# Patient Record
Sex: Male | Born: 1950
Health system: Southern US, Community
[De-identification: ages and names within clinical notes are randomized; demographics above are authoritative.]

## PROBLEM LIST (undated history)

## (undated) DIAGNOSIS — E785 Hyperlipidemia, unspecified: Secondary | ICD-10-CM

## (undated) DIAGNOSIS — I10 Essential (primary) hypertension: Secondary | ICD-10-CM

## (undated) DIAGNOSIS — N319 Neuromuscular dysfunction of bladder, unspecified: Secondary | ICD-10-CM

## (undated) DIAGNOSIS — I6529 Occlusion and stenosis of unspecified carotid artery: Secondary | ICD-10-CM

## (undated) DIAGNOSIS — I639 Cerebral infarction, unspecified: Secondary | ICD-10-CM

## (undated) DIAGNOSIS — M1990 Unspecified osteoarthritis, unspecified site: Secondary | ICD-10-CM

## (undated) DIAGNOSIS — G9581 Conus medullaris syndrome: Secondary | ICD-10-CM

## (undated) DIAGNOSIS — E538 Deficiency of other specified B group vitamins: Secondary | ICD-10-CM

## (undated) HISTORY — DX: Unspecified osteoarthritis, unspecified site: M19.90

## (undated) HISTORY — DX: Conus medullaris syndrome: G95.81

## (undated) HISTORY — DX: Hyperlipidemia, unspecified: E78.5

## (undated) HISTORY — DX: Neuromuscular dysfunction of bladder, unspecified: N31.9

## (undated) HISTORY — DX: Essential (primary) hypertension: I10

## (undated) HISTORY — DX: Deficiency of other specified B group vitamins: E53.8

## (undated) HISTORY — DX: Occlusion and stenosis of unspecified carotid artery: I65.29

## (undated) HISTORY — DX: Cerebral infarction, unspecified: I63.9

---

## 2011-01-16 ENCOUNTER — Encounter (INDEPENDENT_AMBULATORY_CARE_PROVIDER_SITE_OTHER): Payer: PRIVATE HEALTH INSURANCE | Admitting: Surgery

## 2011-01-16 DIAGNOSIS — I6509 Occlusion and stenosis of unspecified vertebral artery: Secondary | ICD-10-CM

## 2011-01-17 NOTE — Assessment & Plan Note (Signed)
OFFICE VISIT  [name] DOB:  [birth year]                                       01/16/2011 CHART#:[id number]  REASON FOR VISIT:  Abnormal MRI.  CHIEF COMPLAINT:  Dizziness.  HISTORY:  This is a very pleasant 60 year old gentleman I am seeing at the request of Dr. Tomasa Blase for abnormal MRI of the head and neck.  The patient has a history of having had a stroke about 10 years ago.  This was a posterior circulation stroke.  He is medically managed for his hypertension and hyperlipidemia.  He has a history of smoking but quit over 10 years ago at the time of his stroke.  He recently within the past month or 2 has been developing some visual disturbances in both eyes that he attributes to fluorescent lights.  These are associated with dizziness and headache.  When he is outside the exposure of fluorescent lights they go away.  They are becoming less frequent.  He denies localizing symptoms such as numbness and weakness in either extremity.  No amaurosis.  No slurred speech.  Because of his symptoms an MRA/MRI was ordered.  This reveals remote infarcts in the cerebellum greater on the left, mid left vertebral artery occlusion, 38% left carotid stenosis.  No significant right carotid stenosis.  REVIEW OF SYSTEMS:  GENERAL:  Negative for fevers, chills, weight gain, weight loss. VASCULAR:  Positive for history of stroke. CARDIAC:  Negative. GI:  Negative. NEURO:  Positive for dizziness and headaches. PULMONARY:  Negative. HEME:  Negative. GU:  Negative. ENT:  Negative. MUSCULOSKELETAL:  Positive for arthritis. PSYCHIATRIC:  Negative. SKIN:  Negative.  PAST MEDICAL HISTORY:  Osteoarthritis, hypertension, history of stroke, hyperlipidemia.  PAST SURGICAL HISTORY:  Cystectomy, pilonidal.  SOCIAL HISTORY:  He is single with 1 child.  Works as a Naval architect. Does not smoke, quit in 2002.  Does not drink.  FAMILY HISTORY:  His father died at age 3  secondary to motor vehicle accident.  Mother had hypertension, coronary artery disease and stroke. She died at age 64.  ALLERGIES:  Penicillin.  MEDICATIONS:  Please see medical record.  PHYSICAL EXAMINATION:  Vital signs:  Heart rate 66, blood pressure 127/77 on the right, 133/85 on the left.  Respiratory rate 12.  General: He is well-appearing, in no distress.  HEENT:  Within normal limits. Lungs:  Clear bilaterally.  No wheezes or rhonchi.  Cardiovascular: Regular rhythm.  No carotid bruits.  Palpable left posterior tibial pulse.  I could not palpate pedal pulses on the right.  Palpable femoral pulses.  Abdomen:  Soft, nontender.  No pulsatile mass. Musculoskeletal:  No major deformities.  Neurological:  No focal deficits.  Skin:  Without rash.  ASSESSMENT:  Extracranial cerebrovascular disease.  PLAN:  The patient I believe is asymptomatic from the specific findings on his MRA.  With regards to the 38% left carotid stenosis I believe this will need to be followed with ultrasound on a yearly basis.  I will have him come back to see me again in 1 year to follow the progression of this stenosis.  The patient's left vertebral artery is occluded.  He has a normal basilar junction so with collateral flow as well as flow from the right side I do not think this needs to be addressed.  This most likely was the source of his  stroke 10 years ago.  I do not believe his extracranial vascular disease is contributing to his symptoms.Marland Kitchen  He will need to be continually managed for his risk factors which include hypertension and hypercholesterolemia.  His most recent cholesterol profile had an LDL in the 70s.  His triglycerides were elevated at 319. I will plan on seeing him back in 1 year.    Jorge Ny, MD Electronically Signed  VWB/MEDQ  D:  01/16/2011  T:  01/17/2011  Job:  [id number]  cc:   Foye Deer, MD

## 2011-02-08 ENCOUNTER — Encounter: Payer: PRIVATE HEALTH INSURANCE | Admitting: Vascular Surgery

## 2011-12-18 ENCOUNTER — Encounter: Payer: Self-pay | Admitting: Surgery

## 2012-01-15 ENCOUNTER — Other Ambulatory Visit: Payer: PRIVATE HEALTH INSURANCE

## 2012-01-15 ENCOUNTER — Ambulatory Visit: Payer: PRIVATE HEALTH INSURANCE | Admitting: Surgery

## 2016-01-26 DIAGNOSIS — R972 Elevated prostate specific antigen [PSA]: Secondary | ICD-10-CM | POA: Insufficient documentation

## 2016-09-25 DIAGNOSIS — R200 Anesthesia of skin: Secondary | ICD-10-CM | POA: Diagnosis not present

## 2016-09-25 DIAGNOSIS — M79604 Pain in right leg: Secondary | ICD-10-CM | POA: Diagnosis not present

## 2016-09-25 DIAGNOSIS — Z6830 Body mass index (BMI) 30.0-30.9, adult: Secondary | ICD-10-CM | POA: Diagnosis not present

## 2016-09-25 DIAGNOSIS — R32 Unspecified urinary incontinence: Secondary | ICD-10-CM | POA: Diagnosis not present

## 2016-09-25 DIAGNOSIS — I10 Essential (primary) hypertension: Secondary | ICD-10-CM | POA: Diagnosis not present

## 2016-09-25 DIAGNOSIS — R202 Paresthesia of skin: Secondary | ICD-10-CM | POA: Diagnosis not present

## 2016-09-25 DIAGNOSIS — R531 Weakness: Secondary | ICD-10-CM | POA: Diagnosis not present

## 2016-09-25 DIAGNOSIS — Z7982 Long term (current) use of aspirin: Secondary | ICD-10-CM | POA: Diagnosis not present

## 2016-09-25 DIAGNOSIS — M5106 Intervertebral disc disorders with myelopathy, lumbar region: Secondary | ICD-10-CM | POA: Diagnosis not present

## 2016-09-25 DIAGNOSIS — Z79899 Other long term (current) drug therapy: Secondary | ICD-10-CM | POA: Diagnosis not present

## 2016-09-25 DIAGNOSIS — M79605 Pain in left leg: Secondary | ICD-10-CM | POA: Diagnosis not present

## 2016-09-25 DIAGNOSIS — R252 Cramp and spasm: Secondary | ICD-10-CM | POA: Diagnosis not present

## 2016-09-25 DIAGNOSIS — R159 Full incontinence of feces: Secondary | ICD-10-CM | POA: Diagnosis not present

## 2016-09-25 DIAGNOSIS — R2689 Other abnormalities of gait and mobility: Secondary | ICD-10-CM | POA: Diagnosis not present

## 2016-09-25 DIAGNOSIS — E785 Hyperlipidemia, unspecified: Secondary | ICD-10-CM | POA: Diagnosis not present

## 2016-09-26 DIAGNOSIS — M47812 Spondylosis without myelopathy or radiculopathy, cervical region: Secondary | ICD-10-CM | POA: Diagnosis not present

## 2016-09-26 DIAGNOSIS — R159 Full incontinence of feces: Secondary | ICD-10-CM | POA: Diagnosis not present

## 2016-09-26 DIAGNOSIS — M898X1 Other specified disorders of bone, shoulder: Secondary | ICD-10-CM | POA: Diagnosis not present

## 2016-09-26 DIAGNOSIS — E538 Deficiency of other specified B group vitamins: Secondary | ICD-10-CM | POA: Diagnosis not present

## 2016-09-26 DIAGNOSIS — Z8673 Personal history of transient ischemic attack (TIA), and cerebral infarction without residual deficits: Secondary | ICD-10-CM | POA: Diagnosis not present

## 2016-09-26 DIAGNOSIS — R531 Weakness: Secondary | ICD-10-CM | POA: Diagnosis not present

## 2016-09-26 DIAGNOSIS — M48061 Spinal stenosis, lumbar region without neurogenic claudication: Secondary | ICD-10-CM | POA: Diagnosis not present

## 2016-09-26 DIAGNOSIS — I10 Essential (primary) hypertension: Secondary | ICD-10-CM | POA: Diagnosis not present

## 2016-09-26 DIAGNOSIS — Z7982 Long term (current) use of aspirin: Secondary | ICD-10-CM | POA: Diagnosis not present

## 2016-09-26 DIAGNOSIS — R29898 Other symptoms and signs involving the musculoskeletal system: Secondary | ICD-10-CM | POA: Diagnosis not present

## 2016-09-26 DIAGNOSIS — M47816 Spondylosis without myelopathy or radiculopathy, lumbar region: Secondary | ICD-10-CM | POA: Diagnosis not present

## 2016-09-26 DIAGNOSIS — R32 Unspecified urinary incontinence: Secondary | ICD-10-CM | POA: Diagnosis not present

## 2016-09-28 DIAGNOSIS — R262 Difficulty in walking, not elsewhere classified: Secondary | ICD-10-CM | POA: Diagnosis not present

## 2016-09-28 DIAGNOSIS — D519 Vitamin B12 deficiency anemia, unspecified: Secondary | ICD-10-CM | POA: Diagnosis not present

## 2016-09-28 DIAGNOSIS — Z6829 Body mass index (BMI) 29.0-29.9, adult: Secondary | ICD-10-CM | POA: Diagnosis not present

## 2016-09-28 DIAGNOSIS — M5126 Other intervertebral disc displacement, lumbar region: Secondary | ICD-10-CM | POA: Diagnosis not present

## 2016-09-28 DIAGNOSIS — R29898 Other symptoms and signs involving the musculoskeletal system: Secondary | ICD-10-CM | POA: Diagnosis not present

## 2016-10-03 DIAGNOSIS — S14129A Central cord syndrome at unspecified level of cervical spinal cord, initial encounter: Secondary | ICD-10-CM | POA: Diagnosis not present

## 2016-10-05 DIAGNOSIS — D510 Vitamin B12 deficiency anemia due to intrinsic factor deficiency: Secondary | ICD-10-CM | POA: Diagnosis not present

## 2016-10-12 DIAGNOSIS — M48061 Spinal stenosis, lumbar region without neurogenic claudication: Secondary | ICD-10-CM | POA: Diagnosis not present

## 2016-10-12 DIAGNOSIS — S14129A Central cord syndrome at unspecified level of cervical spinal cord, initial encounter: Secondary | ICD-10-CM | POA: Diagnosis not present

## 2016-10-12 DIAGNOSIS — X58XXXA Exposure to other specified factors, initial encounter: Secondary | ICD-10-CM | POA: Diagnosis not present

## 2016-10-12 DIAGNOSIS — M5416 Radiculopathy, lumbar region: Secondary | ICD-10-CM | POA: Diagnosis not present

## 2016-10-13 DIAGNOSIS — D510 Vitamin B12 deficiency anemia due to intrinsic factor deficiency: Secondary | ICD-10-CM | POA: Diagnosis not present

## 2016-10-17 DIAGNOSIS — S14129A Central cord syndrome at unspecified level of cervical spinal cord, initial encounter: Secondary | ICD-10-CM | POA: Diagnosis not present

## 2016-10-20 DIAGNOSIS — D510 Vitamin B12 deficiency anemia due to intrinsic factor deficiency: Secondary | ICD-10-CM | POA: Diagnosis not present

## 2016-10-26 DIAGNOSIS — S14129A Central cord syndrome at unspecified level of cervical spinal cord, initial encounter: Secondary | ICD-10-CM | POA: Diagnosis not present

## 2016-10-26 DIAGNOSIS — M47897 Other spondylosis, lumbosacral region: Secondary | ICD-10-CM | POA: Diagnosis not present

## 2016-10-26 DIAGNOSIS — M47817 Spondylosis without myelopathy or radiculopathy, lumbosacral region: Secondary | ICD-10-CM | POA: Diagnosis not present

## 2016-10-27 DIAGNOSIS — D519 Vitamin B12 deficiency anemia, unspecified: Secondary | ICD-10-CM | POA: Diagnosis not present

## 2016-11-06 DIAGNOSIS — R252 Cramp and spasm: Secondary | ICD-10-CM | POA: Diagnosis not present

## 2016-11-06 DIAGNOSIS — S14129A Central cord syndrome at unspecified level of cervical spinal cord, initial encounter: Secondary | ICD-10-CM | POA: Diagnosis not present

## 2016-11-06 DIAGNOSIS — R600 Localized edema: Secondary | ICD-10-CM | POA: Diagnosis not present

## 2016-11-06 DIAGNOSIS — Z6831 Body mass index (BMI) 31.0-31.9, adult: Secondary | ICD-10-CM | POA: Diagnosis not present

## 2016-11-07 ENCOUNTER — Telehealth: Payer: Self-pay | Admitting: Neurology

## 2016-11-07 DIAGNOSIS — M546 Pain in thoracic spine: Secondary | ICD-10-CM | POA: Diagnosis not present

## 2016-11-07 DIAGNOSIS — M5030 Other cervical disc degeneration, unspecified cervical region: Secondary | ICD-10-CM | POA: Diagnosis not present

## 2016-11-07 DIAGNOSIS — M4722 Other spondylosis with radiculopathy, cervical region: Secondary | ICD-10-CM | POA: Diagnosis not present

## 2016-11-07 DIAGNOSIS — M549 Dorsalgia, unspecified: Secondary | ICD-10-CM | POA: Diagnosis not present

## 2016-11-07 DIAGNOSIS — M4712 Other spondylosis with myelopathy, cervical region: Secondary | ICD-10-CM | POA: Diagnosis not present

## 2016-11-07 DIAGNOSIS — M5136 Other intervertebral disc degeneration, lumbar region: Secondary | ICD-10-CM | POA: Diagnosis not present

## 2016-11-07 NOTE — Telephone Encounter (Signed)
I have discussed this case with Dr. Newell CoralNudelman. He will be referred to this office for an evaluation. This is a gentleman who noted onset suddenly with problems with walking in March 2018. [facility], underwent a CT scan of the head that showed an old cerebellar stroke, no acute process noted. [facility], MRI of the cervical, thoracic, and lumbar spine has been done, he has degenerative changes at the C3-4, C4-5, and C5-6 levels without definite cord signal or cord injury. He also has significant degenerative changes in the low back. He has reported some change in bladder function.  He is being sent to this office for further evaluation to determine the etiology of his walking problem, it is not clear that the spine issues are the sole etiology.  The patient may require EMG and nerve conduction study evaluation. He may require further blood work.

## 2016-11-13 DIAGNOSIS — R8290 Unspecified abnormal findings in urine: Secondary | ICD-10-CM | POA: Diagnosis not present

## 2016-11-13 DIAGNOSIS — R319 Hematuria, unspecified: Secondary | ICD-10-CM | POA: Diagnosis not present

## 2016-11-13 DIAGNOSIS — R109 Unspecified abdominal pain: Secondary | ICD-10-CM | POA: Diagnosis not present

## 2016-11-13 DIAGNOSIS — Y846 Urinary catheterization as the cause of abnormal reaction of the patient, or of later complication, without mention of misadventure at the time of the procedure: Secondary | ICD-10-CM | POA: Diagnosis not present

## 2016-11-13 DIAGNOSIS — T839XXA Unspecified complication of genitourinary prosthetic device, implant and graft, initial encounter: Secondary | ICD-10-CM | POA: Diagnosis not present

## 2016-11-13 DIAGNOSIS — Z8673 Personal history of transient ischemic attack (TIA), and cerebral infarction without residual deficits: Secondary | ICD-10-CM | POA: Diagnosis not present

## 2016-11-13 DIAGNOSIS — T83511A Infection and inflammatory reaction due to indwelling urethral catheter, initial encounter: Secondary | ICD-10-CM | POA: Diagnosis not present

## 2016-11-13 DIAGNOSIS — R100 Acute abdomen: Secondary | ICD-10-CM | POA: Diagnosis not present

## 2016-11-13 DIAGNOSIS — R1030 Lower abdominal pain, unspecified: Secondary | ICD-10-CM | POA: Diagnosis not present

## 2016-11-13 DIAGNOSIS — N390 Urinary tract infection, site not specified: Secondary | ICD-10-CM | POA: Diagnosis not present

## 2016-11-13 DIAGNOSIS — Z7982 Long term (current) use of aspirin: Secondary | ICD-10-CM | POA: Diagnosis not present

## 2016-11-13 DIAGNOSIS — R531 Weakness: Secondary | ICD-10-CM | POA: Diagnosis not present

## 2016-11-13 DIAGNOSIS — N39490 Overflow incontinence: Secondary | ICD-10-CM | POA: Diagnosis not present

## 2016-11-13 DIAGNOSIS — R972 Elevated prostate specific antigen [PSA]: Secondary | ICD-10-CM | POA: Diagnosis not present

## 2016-11-13 DIAGNOSIS — R339 Retention of urine, unspecified: Secondary | ICD-10-CM | POA: Diagnosis not present

## 2016-11-15 ENCOUNTER — Encounter (HOSPITAL_COMMUNITY): Payer: Self-pay | Admitting: *Deleted

## 2016-11-15 ENCOUNTER — Emergency Department (HOSPITAL_COMMUNITY): Payer: PPO

## 2016-11-15 ENCOUNTER — Emergency Department (HOSPITAL_COMMUNITY)
Admission: EM | Admit: 2016-11-15 | Discharge: 2016-11-15 | Disposition: A | Discharge status: Home / Self Care | Payer: PPO | Attending: Emergency Medicine | Admitting: Emergency Medicine

## 2016-11-15 DIAGNOSIS — Z79899 Other long term (current) drug therapy: Secondary | ICD-10-CM | POA: Diagnosis not present

## 2016-11-15 DIAGNOSIS — M6281 Muscle weakness (generalized): Secondary | ICD-10-CM | POA: Diagnosis not present

## 2016-11-15 DIAGNOSIS — Z87891 Personal history of nicotine dependence: Secondary | ICD-10-CM | POA: Insufficient documentation

## 2016-11-15 DIAGNOSIS — M48061 Spinal stenosis, lumbar region without neurogenic claudication: Secondary | ICD-10-CM | POA: Diagnosis not present

## 2016-11-15 DIAGNOSIS — Z7982 Long term (current) use of aspirin: Secondary | ICD-10-CM | POA: Diagnosis not present

## 2016-11-15 DIAGNOSIS — Z8673 Personal history of transient ischemic attack (TIA), and cerebral infarction without residual deficits: Secondary | ICD-10-CM | POA: Diagnosis not present

## 2016-11-15 DIAGNOSIS — R200 Anesthesia of skin: Secondary | ICD-10-CM | POA: Insufficient documentation

## 2016-11-15 DIAGNOSIS — I10 Essential (primary) hypertension: Secondary | ICD-10-CM | POA: Diagnosis not present

## 2016-11-15 DIAGNOSIS — R29898 Other symptoms and signs involving the musculoskeletal system: Secondary | ICD-10-CM

## 2016-11-15 LAB — BASIC METABOLIC PANEL
Anion gap: 11 (ref 5–15)
BUN: 40 mg/dL — AB (ref 6–20)
CO2: 20 mmol/L — ABNORMAL LOW (ref 22–32)
CREATININE: 1.53 mg/dL — AB (ref 0.61–1.24)
Calcium: 8.7 mg/dL — ABNORMAL LOW (ref 8.9–10.3)
Chloride: 108 mmol/L (ref 101–111)
GFR calc Af Amer: 53 mL/min — ABNORMAL LOW (ref >60)
GFR, EST NON AFRICAN AMERICAN: 46 mL/min — AB (ref >60)
GLUCOSE: 128 mg/dL — AB (ref 65–99)
POTASSIUM: 4.2 mmol/L (ref 3.5–5.1)
Sodium: 139 mmol/L (ref 135–145)

## 2016-11-15 LAB — URINALYSIS, ROUTINE W REFLEX MICROSCOPIC
BACTERIA UA: NONE SEEN
Bilirubin Urine: NEGATIVE
Glucose, UA: NEGATIVE mg/dL
Ketones, ur: NEGATIVE mg/dL
Nitrite: NEGATIVE
Protein, ur: >=300 mg/dL — AB
SPECIFIC GRAVITY, URINE: 1.022 (ref 1.005–1.030)
SQUAMOUS EPITHELIAL / LPF: NONE SEEN
pH: 5 (ref 5.0–8.0)

## 2016-11-15 LAB — HEPATIC FUNCTION PANEL
ALK PHOS: 70 U/L (ref 38–126)
ALT: 25 U/L (ref 17–63)
AST: 25 U/L (ref 15–41)
Albumin: 3 g/dL — ABNORMAL LOW (ref 3.5–5.0)
Bilirubin, Direct: 0.1 mg/dL (ref 0.1–0.5)
Indirect Bilirubin: 0.3 mg/dL (ref 0.3–0.9)
TOTAL PROTEIN: 6.7 g/dL (ref 6.5–8.1)
Total Bilirubin: 0.4 mg/dL (ref 0.3–1.2)

## 2016-11-15 LAB — CBC
HEMATOCRIT: 35.9 % — AB (ref 39.0–52.0)
Hemoglobin: 11.6 g/dL — ABNORMAL LOW (ref 13.0–17.0)
MCH: 28.9 pg (ref 26.0–34.0)
MCHC: 32.3 g/dL (ref 30.0–36.0)
MCV: 89.3 fL (ref 78.0–100.0)
PLATELETS: 334 10*3/uL (ref 150–400)
RBC: 4.02 MIL/uL — ABNORMAL LOW (ref 4.22–5.81)
RDW: 14.2 % (ref 11.5–15.5)
WBC: 11.8 10*3/uL — AB (ref 4.0–10.5)

## 2016-11-15 LAB — I-STAT CG4 LACTIC ACID, ED: LACTIC ACID, VENOUS: 1.56 mmol/L (ref 0.5–1.9)

## 2016-11-15 MED ORDER — ZOLPIDEM TARTRATE ER 12.5 MG PO TBCR: 12.5000 mg | EXTENDED_RELEASE_TABLET | Freq: Every evening | ORAL | 0 refills | Status: DC | PRN

## 2016-11-15 MED ORDER — PREDNISONE 10 MG PO TABS: 40.0000 mg | ORAL_TABLET | Freq: Every day | ORAL | 0 refills | Status: AC

## 2016-11-15 MED ORDER — SODIUM CHLORIDE 0.9 % IV SOLN
INTRAVENOUS | Status: DC
  Administered 2016-11-15: 16:00:00 via INTRAVENOUS

## 2016-11-15 MED ORDER — ZOLPIDEM TARTRATE ER 6.25 MG PO TBCR: 6.2500 mg | EXTENDED_RELEASE_TABLET | Freq: Every evening | ORAL | 0 refills | Status: DC | PRN

## 2016-11-15 MED ORDER — LORAZEPAM 2 MG/ML IJ SOLN
1.0000 mg | Freq: Once | INTRAMUSCULAR | Status: AC
  Administered 2016-11-15: 1 mg via INTRAVENOUS
  Filled 2016-11-15: qty 1

## 2016-11-15 MED ORDER — ACETAMINOPHEN 500 MG PO TABS
1000.0000 mg | ORAL_TABLET | Freq: Once | ORAL | Status: AC
  Administered 2016-11-15: 1000 mg via ORAL
  Filled 2016-11-15: qty 2

## 2016-11-15 NOTE — ED Notes (Signed)
Patient transported to MRI via stretcher.

## 2016-11-15 NOTE — ED Notes (Signed)
Patient verbalized understanding of discharge instructions and denies any further needs or questions at this time. VS stable. Patient ambulatory with steady gait, RN assisted to ED entrance in wheelchair.   

## 2016-11-15 NOTE — ED Notes (Signed)
Pt reports he had a catheter placed Monday (two days ago) r/t not being able to empty his bladder. Family at bedside reports pt had bladder infection r/t not being able to empty bladder. Pt states when the catheter was inserted it "felt like 1000 hot needles going into the right leg". Pt states he has had weakness in legs since the last week of March. He reports he had 2 epidural steroids in the month of April.

## 2016-11-15 NOTE — ED Triage Notes (Signed)
Pt sent here for eval from PCP, pt c/o bil hip & leg tingling and pain, pt c/o generalized severe body aches, pt reports inability to ambulate since foley cath placement at urology office this past Monday d/t inability to void, pt takes daily ASA, A&O x4, denies slurred speech, no facial droop present, symptom onset x 3 months ago

## 2016-11-15 NOTE — ED Notes (Signed)
Pt given turkey sandwich per RN. 

## 2016-11-15 NOTE — ED Notes (Signed)
Patient returned from MRI and placed back on monitor

## 2016-11-15 NOTE — ED Notes (Signed)
Pt deneis pain stating his legs fell "asleep"

## 2016-11-15 NOTE — ED Provider Notes (Signed)
[facility]EMERGENCY DEPT Provider Note   CSN: [id number] Arrival date & time: 11/15/16  1202     History   Chief Complaint Chief Complaint  Patient presents with  . Back Pain  . Extremity Weakness    HPI [name] is a 66 y.o. male.  Patient since March is been having trouble with numbness and weakness to his lower extremities from the waist down. Including some saddle numbness and difficulty voiding and having bowel movements. Patient initially transferred from [facility] to [facility] where he underwent MRI evaluation by neurosurgery sounds like there was also evaluation by neurology. Patient eventually had steroid injections which the first one helped second one did not help. Patient now followed by neurosurgery locally Dr. Ezzard Standing who reviewed his MRIs and found no structural abnormalities. He referred him to Dr. Anne Hahn of [facility] neurology. Patient has not been seen by them yet. Patient's last MRI was in the end of March. Patient states symptoms are getting worse. In the meantime had seen urology for difficulty emptying his bladder and had done Foley catheter place with leg bag. Supposedly had a urinary tract infection at that time. Patient has stated above states symptoms are getting worse. Basically feels like he is numb from the waist down. Waist up he feels normal. Patient denies fevers headaches and neck problems or any other symptoms.  What is very interesting is that the patient has no pain associated with this. Does get a burning sensation at times tingling sensation but no pain.      Past Medical History:  Diagnosis Date  . Arthritis   . Carotid artery occlusion   . Hyperlipidemia   . Hypertension   . Stroke [facility])    posterior circulation    There are no active problems to display for this patient.   History reviewed. No pertinent surgical history.     Home Medications    Prior to Admission medications   Medication Sig Start Date End Date Taking?  Authorizing Provider  aspirin 81 MG tablet Take 81 mg by mouth daily.    [provider]  cephALEXin (KEFLEX) 500 MG capsule Take 500 mg by mouth 3 (three) times daily.    [provider]  fenofibrate 160 MG tablet Take 160 mg by mouth daily.    [provider]  lisinopril (PRINIVIL,ZESTRIL) 10 MG tablet Take 10 mg by mouth daily.    [provider]  Multiple Vitamins-Minerals (MULTIVITAMIN WITH MINERALS) tablet Take 1 tablet by mouth daily.    [provider]  Organ Preservation Solution [facility]) external solution Apply 500 mLs topically once.    [provider]  simvastatin (ZOCOR) 40 MG tablet Take 40 mg by mouth every evening.    [provider]    Family History Family History  Problem Relation Age of Onset  . Hypertension Mother   . Stroke Mother   . Coronary artery disease Mother     Social History Social History  Substance Use Topics  . Smoking status: Former Smoker    Types: Cigarettes    Quit date: 07/03/2000  . Smokeless tobacco: Never Used  . Alcohol use No     Allergies   Penicillins   Review of Systems Review of Systems   Physical Exam Updated Vital Signs BP 122/68   Pulse 88   Temp 98 F (36.7 C) (Oral)   Resp (!) 21   Ht 5\' 8"  (1.727 m)   Wt 90.7 kg   SpO2 95%  BMI 30.41 kg/m   Physical Exam  Constitutional: He is oriented to person, place, and time. He appears well-developed and well-nourished. No distress.  HENT:  Head: Normocephalic and atraumatic.  Mouth/Throat: Oropharynx is clear and moist.  Eyes: EOM are normal. Pupils are equal, round, and reactive to light.  Neck: Normal range of motion. Neck supple.  Cardiovascular: Normal rate and regular rhythm.   Pulmonary/Chest: Effort normal and breath sounds normal. No respiratory distress.  Abdominal: Soft. Bowel sounds are normal.  Musculoskeletal: Normal range of motion. He exhibits no edema.  Leg Capillary Refill is less than  2 seconds.  Neurological: He is alert and oriented to person, place, and time. No cranial nerve deficit or sensory deficit. He exhibits normal muscle tone. Coordination normal.  Patient has decreased sensation to lower extremities and some decreased strength but does have movement. Does have some sensation. Also has decreased sensation in the saddle area.  Skin: Capillary refill takes less than 2 seconds.  Nursing note and vitals reviewed.    ED Treatments / Results  Labs (all labs ordered are listed, but only abnormal results are displayed) Labs Reviewed  BASIC METABOLIC PANEL - Abnormal; Notable for the following:       Result Value   CO2 20 (*)    Glucose, Bld 128 (*)    BUN 40 (*)    Creatinine, Ser 1.53 (*)    Calcium 8.7 (*)    GFR calc non Af Amer 46 (*)    GFR calc Af Amer 53 (*)    All other components within normal limits  CBC - Abnormal; Notable for the following:    WBC 11.8 (*)    RBC 4.02 (*)    Hemoglobin 11.6 (*)    HCT 35.9 (*)    All other components within normal limits  URINE CULTURE  URINALYSIS, ROUTINE W REFLEX MICROSCOPIC  HEPATIC FUNCTION PANEL  I-STAT CG4 LACTIC ACID, ED  I-STAT CG4 LACTIC ACID, ED   Results for orders placed or performed during the hospital encounter of 11/15/16  Basic metabolic panel  Result Value Ref Range   Sodium 139 135 - 145 mmol/L   Potassium 4.2 3.5 - 5.1 mmol/L   Chloride 108 101 - 111 mmol/L   CO2 20 (L) 22 - 32 mmol/L   Glucose, Bld 128 (H) 65 - 99 mg/dL   BUN 40 (H) 6 - 20 mg/dL   Creatinine, Ser 6.211.53 (H) 0.61 - 1.24 mg/dL   Calcium 8.7 (L) 8.9 - 10.3 mg/dL   GFR calc non Af Amer 46 (L) >60 mL/min   GFR calc Af Amer 53 (L) >60 mL/min   Anion gap 11 5 - 15  CBC  Result Value Ref Range   WBC 11.8 (H) 4.0 - 10.5 K/uL   RBC 4.02 (L) 4.22 - 5.81 MIL/uL   Hemoglobin 11.6 (L) 13.0 - 17.0 g/dL   HCT 30.835.9 (L) 65.739.0 - 84.652.0 %   MCV 89.3 78.0 - 100.0 fL   MCH 28.9 26.0 - 34.0 pg   MCHC 32.3 30.0 - 36.0 g/dL   RDW 96.214.2  95.211.5 - 84.115.5 %   Platelets 334 150 - 400 K/uL  I-Stat CG4 Lactic Acid, ED  Result Value Ref Range   Lactic Acid, Venous 1.56 0.5 - 1.9 mmol/L     EKG  EKG Interpretation None       Radiology No results found.  Procedures Procedures (including critical care time)  Medications Ordered in ED Medications  0.9 %  sodium chloride infusion ( Intravenous New Bag/Given 11/15/16 1543)  LORazepam (ATIVAN) injection 1 mg (1 mg Intravenous Given 11/15/16 1543)     Initial Impression / Assessment and Plan / ED Course  I have reviewed the triage vital signs and the nursing notes.  Pertinent labs & imaging results that were available during my care of the patient were reviewed by me and considered in my medical decision making (see chart for details).     Patient with a complex presentation is been ongoing since March. But since patient has not had a MRI since the end of March and states symptoms are getting worse. Told him that we will repeat MRI to rule out any structural problems since his symptoms are somewhat consistent with a cauda equina syndrome. We'll also check his urine for urinary tract infection. Check basic labs. Told him we may not have an answer. But once we have the MRI we will least discuss it with the neuro hospitalist.  Patient symptoms are unusual in that there is no pain associated with it.  Patient will be turned over to the evening emergency physician.  Final Clinical Impressions(s) / ED Diagnoses   Final diagnoses:  Weakness of both lower extremities  Numbness of lower extremity    New Prescriptions New Prescriptions   No medications on file     Vanetta Mulders, MD 11/15/16 1622

## 2016-11-16 LAB — URINE CULTURE: CULTURE: NO GROWTH

## 2016-11-20 DIAGNOSIS — R339 Retention of urine, unspecified: Secondary | ICD-10-CM | POA: Insufficient documentation

## 2016-11-20 DIAGNOSIS — N39490 Overflow incontinence: Secondary | ICD-10-CM | POA: Diagnosis not present

## 2016-11-20 DIAGNOSIS — N390 Urinary tract infection, site not specified: Secondary | ICD-10-CM | POA: Diagnosis not present

## 2016-11-20 DIAGNOSIS — R972 Elevated prostate specific antigen [PSA]: Secondary | ICD-10-CM | POA: Diagnosis not present

## 2016-11-21 DIAGNOSIS — N319 Neuromuscular dysfunction of bladder, unspecified: Secondary | ICD-10-CM | POA: Diagnosis not present

## 2016-11-22 DIAGNOSIS — R339 Retention of urine, unspecified: Secondary | ICD-10-CM | POA: Diagnosis not present

## 2016-11-23 ENCOUNTER — Ambulatory Visit (INDEPENDENT_AMBULATORY_CARE_PROVIDER_SITE_OTHER): Payer: PPO | Admitting: Neurology

## 2016-11-23 ENCOUNTER — Encounter: Payer: Self-pay | Admitting: Neurology

## 2016-11-23 VITALS — BP 158/81 | HR 98 | Ht 68.0 in | Wt 204.0 lb

## 2016-11-23 DIAGNOSIS — M21371 Foot drop, right foot: Secondary | ICD-10-CM

## 2016-11-23 DIAGNOSIS — M21372 Foot drop, left foot: Secondary | ICD-10-CM | POA: Diagnosis not present

## 2016-11-23 DIAGNOSIS — G9581 Conus medullaris syndrome: Secondary | ICD-10-CM

## 2016-11-23 HISTORY — DX: Conus medullaris syndrome: G95.81

## 2016-11-23 NOTE — Progress Notes (Signed)
Reason for visit: Leg weakness  Referring physician: Dr. Dell Ponto Lacson is a 66 y.o. male  History of present illness:  Mr. Hoefle is a 66 year old right-handed white male with a history of cerebrovascular disease and hypertension. The patient was in his usual state of health until the latter part of March 2018. The patient indicates that he went to take a nap, when he woke up he had severe paresthesias and discomfort from the hip levels down the legs, he had weakness of the legs and difficulty walking, and he had lack of bowel and bladder control. The patient claims that 3 or 4 days prior to the onset of this he was having some cramping sensations in the calf muscle on the right leg. The patient went to [facility], MRI of the low back was done and the patient was subsequently transferred to [facility]. At that point, the patient had MRI evaluation of the cervical spine, thoracic spine, and a repeat MRI of the lumbar spine. The disc of the studies was brought for my review. The patient has evidence of increased spinal cord signal from the T11 level down to the conus. The patient did not undergo a lumbar puncture. The patient did have a vitamin B12 level that was 180, the patient has been undergoing B12 injections. The first injection seemed to help some, but subsequent injections have not improved symptoms much. The patient is mainly left with a saddle distribution numbness, he still has bladder dysfunction with an indwelling catheter, he apparently underwent an epidural steroid injection on 2 occasions with seemed to help his bowel control. The patient has not been set up for physical therapy. He has bilateral foot drops, he has fallen on one occasion, he is using a cane for ambulation. He still has numbness in the feet that is present. The patient denies any neck pain or pain down arms, no weakness or numbness of the arms. He has not had any headaches, vision changes, speech or swallowing  problems, double vision or loss of vision. He has not had any memory changes. He was seen by Dr. Newell Coral who did not see evidence of a compressive lesion that would explain his symptoms. He is sent to this office for an evaluation.   Past Medical History:  Diagnosis Date  . Arthritis   . Carotid artery occlusion   . Hyperlipidemia   . Hypertension   . Stroke [facility])    posterior circulation    History reviewed. No pertinent surgical history.  Family History  Problem Relation Age of Onset  . Hypertension Mother   . Stroke Mother   . Coronary artery disease Mother     Social history:  reports that he quit smoking about 16 years ago. His smoking use included Cigarettes. He has never used smokeless tobacco. He reports that he does not drink alcohol or use drugs.  Medications:  Prior to Admission medications   Medication Sig Start Date End Date Taking? Authorizing Provider  acetaminophen (TYLENOL) 325 MG tablet Take 650 mg by mouth every 6 (six) hours as needed (for pain).   Yes [provider]  alfuzosin (UROXATRAL) 10 MG 24 hr tablet Take 10 mg by mouth daily.   Yes [provider]  aspirin EC 325 MG tablet Take 325 mg by mouth daily.   Yes [provider]  docusate sodium (RA COL-RITE) 100 MG capsule Take 100 mg by mouth 2 (two) times daily as needed for mild constipation.  Yes [provider]  lisinopril (PRINIVIL,ZESTRIL) 20 MG tablet Take 20 mg by mouth 2 (two) times daily.   Yes [provider]  nitrofurantoin, macrocrystal-monohydrate, (MACROBID) 100 MG capsule Take 100 mg by mouth daily. 11/21/16  Yes [provider]  rOPINIRole (REQUIP) 1 MG tablet Take 1 mg by mouth at bedtime.   Yes [provider]  simvastatin (ZOCOR) 80 MG tablet Take 80 mg by mouth at bedtime.   Yes [provider]      Allergies  Allergen Reactions  . Penicillins Other (See Comments)    Feet peel Has patient had a PCN reaction  causing immediate rash, facial/tongue/throat swelling, SOB or lightheadedness with hypotension: Yes Has patient had a PCN reaction causing severe rash involving mucus membranes or skin necrosis: No Has patient had a PCN reaction that required hospitalization No Has patient had a PCN reaction occurring within the last 10 years: No If all of the above answers are "NO", then may proceed with Cephalosporin use.     ROS:  Out of a complete 14 system review of symptoms, the patient complains only of the following symptoms, and all other reviewed systems are negative.  Swelling in the legs Urination problems Numbness, weakness  Blood pressure (!) 158/81, pulse 98, height 5\' 8"  (1.727 m), weight 204 lb (92.5 kg).  Physical Exam  General: The patient is alert and cooperative at the time of the examination.  Eyes: Pupils are equal, round, and reactive to light. Discs are flat bilaterally.  Neck: The neck is supple, no carotid bruits are noted.  Respiratory: The respiratory examination is clear.  Cardiovascular: The cardiovascular examination reveals a regular rate and rhythm, no obvious murmurs or rubs are noted.  Skin: Extremities are with 1+ edema at the feet bilaterally.  Neurologic Exam  Mental status: The patient is alert and oriented x 3 at the time of the examination. The patient has apparent normal recent and remote memory, with an apparently normal attention span and concentration ability.  Cranial nerves: Facial symmetry is present. There is good sensation of the face to pinprick and soft touch bilaterally. The strength of the facial muscles and the muscles to head turning and shoulder shrug are normal bilaterally. Speech is well enunciated, no aphasia or dysarthria is noted. Extraocular movements are full. Visual fields are full. The tongue is midline, and the patient has symmetric elevation of the soft palate. No obvious hearing deficits are noted.  Motor: The motor testing  reveals 5 over 5 strength of all 4 extremities, with exception of weakness with dorsiflexion, inversion and inversion and plantar flexion of the feet. Good symmetric motor tone is noted throughout.  Sensory: Sensory testing is intact to pinprick, soft touch, vibration sensation, and position sense on all 4 extremities. No evidence of extinction is noted.  Coordination: Cerebellar testing reveals good finger-nose-finger and heel-to-shin bilaterally.  Gait and station: Gait is slightly wide-based, the patient can walk independent. Tandem gait is slightly unsteady. Romberg is negative. No drift is seen.  Reflexes: Deep tendon reflexes are symmetric and normal bilaterally, with exception of some slight decrease in the ankle jerk reflexes bilaterally but the reflexes are present. Toes are downgoing bilaterally.   MRI thoracic 09/26/16:  1.Abnormal signal within the central cord spanning T11 down to the conus, with the signal abnormality predominantly confined to the cord gray matter with mild expansion of the conus. This is nonspecific, however the differential includes transverse myelitis and demyelinating disease, with infarct within the  differential however considered less likely given the clinical history. 2.No abnormal cord enhancement.   MRI lumbar 09/26/16:  1.Abnormal T2 and STIR signal within the distal cord spanning T11 down to the conus predominately confined to the gray matter. The differential includes entities such as transverse myelitis, and demyelinating disease, with infarct in the differential however considered less likely given the reported clinical history. 2.Multilevel lumbar spondylosis as detailed above with moderate canal stenosis at L3-L4 and L4-L5, and multilevel foraminal stenosis, advanced on the left and moderate right at L4-L5 and moderate to advanced on the right at L3-L4. 3.Mild left hydroureteronephrosis, which is incompletely evaluated. Correlate with any  clinical symptoms of ureteral obstruction.   MRI cervical 09/26/16:  1.Multilevel cervical spondylosis with moderate canal stenosis spanning C3-C4 down to C5-C6. 2.Multilevel foraminal stenosis, advanced on the left and moderate on the right at C3-C4, moderate bilaterally at C4-C5, moderate to advanced bilaterally at C5-C6, and moderate on the left C6-C7. 3.No cervical cord signal abnormality.    Assessment/Plan:  1. Conus medullaris syndrome  2. Gait disturbance, bilateral foot drops  3. Neurogenic bladder  The patient has developed a sudden onset conus medullaris syndrome. The differential diagnosis includes spinal cord ischemia, transverse myelitis, or even possibly demyelinating disease. The patient will be set up for further blood work today, he will have lumbar puncture done. He will be set up for physical therapy. A prescription was written for bilateral AFO braces. The patient will follow-up in 4 months.  Marlan Palau. Keith Leesa Leifheit MD 11/23/2016 8:42 AM  [facility] [address] [place], [place] [id number]  Phone [phone] Fax [phone]

## 2016-11-23 NOTE — Patient Instructions (Signed)
   We will get blood work and a spinal tap.  We will get physical therapy, consider bilateral AFO for the foot drops.

## 2016-11-25 LAB — HIV ANTIBODY (ROUTINE TESTING W REFLEX): HIV Screen 4th Generation wRfx: NONREACTIVE

## 2016-11-25 LAB — B. BURGDORFI ANTIBODIES: Lyme IgG/IgM Ab: <0.91 {ISR} (ref 0.00–0.90)

## 2016-11-25 LAB — PAN-ANCA
ANCA Proteinase 3: <3.5 U/mL (ref 0.0–3.5)
Atypical pANCA: <1:20 {titer}

## 2016-11-25 LAB — ANGIOTENSIN CONVERTING ENZYME: Angio Convert Enzyme: <15 U/L (ref 14–82)

## 2016-11-25 LAB — SEDIMENTATION RATE: SED RATE: 53 mm/h — AB (ref 0–30)

## 2016-11-25 LAB — RHEUMATOID FACTOR: Rhuematoid fact SerPl-aCnc: 42.9 IU/mL — ABNORMAL HIGH (ref 0.0–13.9)

## 2016-11-25 LAB — ANA W/REFLEX: ANA: NEGATIVE

## 2016-11-25 LAB — RPR: RPR Ser Ql: NONREACTIVE

## 2016-11-26 ENCOUNTER — Telehealth: Payer: Self-pay | Admitting: Neurology

## 2016-11-26 DIAGNOSIS — G9581 Conus medullaris syndrome: Secondary | ICD-10-CM

## 2016-11-26 NOTE — Telephone Encounter (Signed)
I called patient. The blood work shows an elevated sedimentation rate, and a positive rheumatoid factor.  I will check a CCP antibody. If this is positive, we may consider a referral to a rheumatologist. Lumbar puncture is pending.

## 2016-11-28 ENCOUNTER — Other Ambulatory Visit (INDEPENDENT_AMBULATORY_CARE_PROVIDER_SITE_OTHER): Payer: Self-pay

## 2016-11-28 ENCOUNTER — Telehealth: Payer: Self-pay | Admitting: Neurology

## 2016-11-28 DIAGNOSIS — D519 Vitamin B12 deficiency anemia, unspecified: Secondary | ICD-10-CM | POA: Diagnosis not present

## 2016-11-28 DIAGNOSIS — Z0289 Encounter for other administrative examinations: Secondary | ICD-10-CM

## 2016-11-28 DIAGNOSIS — G9581 Conus medullaris syndrome: Secondary | ICD-10-CM

## 2016-11-28 NOTE — Telephone Encounter (Signed)
Called Toniann FailWendy back. She directed me to correct order forms to have CW,MD print/sign for LP. She requested completed forms be faxed to [phone]. She would also like most recent OV note sent. Advised I will send once completed.

## 2016-11-28 NOTE — Telephone Encounter (Signed)
Faxed completed/signed order forms as requested to number below. Received fax confirmation.

## 2016-11-28 NOTE — Telephone Encounter (Signed)
[facility] [phone] called said she needs clarification and new orders for LP. She is needing to know what is being ordered, said it is not specific. She said Rn will need to go to [email or web address] website, at the bottom of the screen go to outpatient order forms. She said to call her for further steps.

## 2016-11-28 NOTE — Telephone Encounter (Signed)
Noted, thank you

## 2016-11-28 NOTE — Telephone Encounter (Signed)
FYI only.

## 2016-11-28 NOTE — Telephone Encounter (Signed)
Toniann FailWendy called back, she has the pt scheduled for 5/31 @ 10:15. She said his wife is aware of the appt.

## 2016-11-30 ENCOUNTER — Ambulatory Visit: Payer: PPO | Admitting: Neurology

## 2016-11-30 ENCOUNTER — Telehealth: Payer: Self-pay | Admitting: Neurology

## 2016-11-30 ENCOUNTER — Encounter: Payer: Self-pay | Admitting: Neurology

## 2016-11-30 DIAGNOSIS — G9581 Conus medullaris syndrome: Secondary | ICD-10-CM | POA: Diagnosis not present

## 2016-11-30 DIAGNOSIS — G0491 Myelitis, unspecified: Secondary | ICD-10-CM | POA: Diagnosis not present

## 2016-11-30 LAB — SEDIMENTATION RATE: Sed Rate: 42 mm/hr — ABNORMAL HIGH (ref 0–30)

## 2016-11-30 LAB — CYCLIC CITRUL PEPTIDE ANTIBODY, IGG/IGA: CYCLIC CITRULLIN PEPTIDE AB: 7 U (ref 0–19)

## 2016-11-30 LAB — C-REACTIVE PROTEIN: CRP: 11.3 mg/L — AB (ref 0.0–4.9)

## 2016-11-30 NOTE — Telephone Encounter (Signed)
I called the patient and talked with the wife. The sedimentation rate is improving, now 42. The CCP test was negative although the rheumatoid factor was positive. We will continue to follow, may consider a rheumatology referral in the future if the sedimentation rate does not completely normalize.

## 2016-12-04 DIAGNOSIS — R339 Retention of urine, unspecified: Secondary | ICD-10-CM | POA: Diagnosis not present

## 2016-12-04 DIAGNOSIS — R972 Elevated prostate specific antigen [PSA]: Secondary | ICD-10-CM | POA: Diagnosis not present

## 2016-12-04 DIAGNOSIS — N319 Neuromuscular dysfunction of bladder, unspecified: Secondary | ICD-10-CM | POA: Diagnosis not present

## 2016-12-04 DIAGNOSIS — N39490 Overflow incontinence: Secondary | ICD-10-CM | POA: Diagnosis not present

## 2016-12-04 DIAGNOSIS — N1330 Unspecified hydronephrosis: Secondary | ICD-10-CM | POA: Diagnosis not present

## 2016-12-05 DIAGNOSIS — M6281 Muscle weakness (generalized): Secondary | ICD-10-CM | POA: Diagnosis not present

## 2016-12-05 DIAGNOSIS — R2689 Other abnormalities of gait and mobility: Secondary | ICD-10-CM | POA: Diagnosis not present

## 2016-12-05 DIAGNOSIS — R2681 Unsteadiness on feet: Secondary | ICD-10-CM | POA: Diagnosis not present

## 2016-12-05 DIAGNOSIS — M2560 Stiffness of unspecified joint, not elsewhere classified: Secondary | ICD-10-CM | POA: Diagnosis not present

## 2016-12-05 DIAGNOSIS — G9581 Conus medullaris syndrome: Secondary | ICD-10-CM | POA: Diagnosis not present

## 2016-12-06 ENCOUNTER — Telehealth: Payer: Self-pay | Admitting: Neurology

## 2016-12-06 NOTE — Telephone Encounter (Signed)
I called patient with spinal fluid results, everything was unremarkable with exception of very minimal elevation in protein level. Oligoclonal banding was negative. 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  The patient had one white blood cell, 2 red blood cells, no oligoclonal banding, VDRL was negative, spinal fluid glucose was 58 and spinal fluid protein was 62.  Investigation of the distal aorta may be required.

## 2016-12-07 ENCOUNTER — Telehealth: Payer: Self-pay | Admitting: *Deleted

## 2016-12-07 NOTE — Telephone Encounter (Signed)
Faxed signed orders for POC back to [facility]. Fax: [phone]. Received confirmation.  

## 2016-12-12 DIAGNOSIS — R339 Retention of urine, unspecified: Secondary | ICD-10-CM | POA: Diagnosis not present

## 2016-12-20 ENCOUNTER — Telehealth: Payer: Self-pay | Admitting: *Deleted

## 2016-12-20 DIAGNOSIS — I639 Cerebral infarction, unspecified: Secondary | ICD-10-CM | POA: Diagnosis not present

## 2016-12-20 DIAGNOSIS — R972 Elevated prostate specific antigen [PSA]: Secondary | ICD-10-CM | POA: Diagnosis not present

## 2016-12-20 DIAGNOSIS — I10 Essential (primary) hypertension: Secondary | ICD-10-CM | POA: Diagnosis not present

## 2016-12-20 DIAGNOSIS — Z6832 Body mass index (BMI) 32.0-32.9, adult: Secondary | ICD-10-CM | POA: Diagnosis not present

## 2016-12-20 DIAGNOSIS — E785 Hyperlipidemia, unspecified: Secondary | ICD-10-CM | POA: Diagnosis not present

## 2016-12-20 DIAGNOSIS — G9581 Conus medullaris syndrome: Secondary | ICD-10-CM | POA: Diagnosis not present

## 2016-12-20 DIAGNOSIS — D519 Vitamin B12 deficiency anemia, unspecified: Secondary | ICD-10-CM | POA: Diagnosis not present

## 2016-12-20 NOTE — Telephone Encounter (Signed)
Faxed printed/signed order for bilateral ankle foot orthosis back to [facility]. Fax: [phone]. Received confirmation.

## 2016-12-27 DIAGNOSIS — M21372 Foot drop, left foot: Secondary | ICD-10-CM | POA: Diagnosis not present

## 2016-12-27 DIAGNOSIS — M21371 Foot drop, right foot: Secondary | ICD-10-CM | POA: Diagnosis not present

## 2016-12-29 DIAGNOSIS — D510 Vitamin B12 deficiency anemia due to intrinsic factor deficiency: Secondary | ICD-10-CM | POA: Diagnosis not present

## 2017-01-01 DIAGNOSIS — G9581 Conus medullaris syndrome: Secondary | ICD-10-CM | POA: Diagnosis not present

## 2017-01-01 DIAGNOSIS — M6281 Muscle weakness (generalized): Secondary | ICD-10-CM | POA: Diagnosis not present

## 2017-01-01 DIAGNOSIS — N1330 Unspecified hydronephrosis: Secondary | ICD-10-CM | POA: Diagnosis not present

## 2017-01-01 DIAGNOSIS — R2681 Unsteadiness on feet: Secondary | ICD-10-CM | POA: Diagnosis not present

## 2017-01-01 DIAGNOSIS — M2560 Stiffness of unspecified joint, not elsewhere classified: Secondary | ICD-10-CM | POA: Diagnosis not present

## 2017-01-01 DIAGNOSIS — R2689 Other abnormalities of gait and mobility: Secondary | ICD-10-CM | POA: Diagnosis not present

## 2017-01-02 DIAGNOSIS — N319 Neuromuscular dysfunction of bladder, unspecified: Secondary | ICD-10-CM | POA: Diagnosis not present

## 2017-01-02 DIAGNOSIS — R339 Retention of urine, unspecified: Secondary | ICD-10-CM | POA: Diagnosis not present

## 2017-01-02 DIAGNOSIS — R972 Elevated prostate specific antigen [PSA]: Secondary | ICD-10-CM | POA: Diagnosis not present

## 2017-01-08 DIAGNOSIS — R339 Retention of urine, unspecified: Secondary | ICD-10-CM | POA: Diagnosis not present

## 2017-01-09 DIAGNOSIS — R339 Retention of urine, unspecified: Secondary | ICD-10-CM | POA: Diagnosis not present

## 2017-01-18 DIAGNOSIS — N183 Chronic kidney disease, stage 3 (moderate): Secondary | ICD-10-CM | POA: Diagnosis not present

## 2017-01-29 DIAGNOSIS — R339 Retention of urine, unspecified: Secondary | ICD-10-CM | POA: Diagnosis not present

## 2017-01-31 DIAGNOSIS — D519 Vitamin B12 deficiency anemia, unspecified: Secondary | ICD-10-CM | POA: Diagnosis not present

## 2017-02-21 DIAGNOSIS — R339 Retention of urine, unspecified: Secondary | ICD-10-CM | POA: Diagnosis not present

## 2017-03-07 DIAGNOSIS — D510 Vitamin B12 deficiency anemia due to intrinsic factor deficiency: Secondary | ICD-10-CM | POA: Diagnosis not present

## 2017-03-13 DIAGNOSIS — N319 Neuromuscular dysfunction of bladder, unspecified: Secondary | ICD-10-CM | POA: Diagnosis not present

## 2017-03-13 DIAGNOSIS — R972 Elevated prostate specific antigen [PSA]: Secondary | ICD-10-CM | POA: Diagnosis not present

## 2017-03-13 DIAGNOSIS — R339 Retention of urine, unspecified: Secondary | ICD-10-CM | POA: Diagnosis not present

## 2017-03-19 DIAGNOSIS — R339 Retention of urine, unspecified: Secondary | ICD-10-CM | POA: Diagnosis not present

## 2017-04-09 ENCOUNTER — Ambulatory Visit: Payer: PPO | Admitting: Neurology

## 2017-04-09 ENCOUNTER — Telehealth: Payer: Self-pay | Admitting: Neurology

## 2017-04-09 DIAGNOSIS — R339 Retention of urine, unspecified: Secondary | ICD-10-CM | POA: Diagnosis not present

## 2017-04-09 NOTE — Telephone Encounter (Signed)
This patient cancelled the same day of the RV today.

## 2017-04-10 DIAGNOSIS — Z1211 Encounter for screening for malignant neoplasm of colon: Secondary | ICD-10-CM | POA: Diagnosis not present

## 2017-04-10 DIAGNOSIS — Z1389 Encounter for screening for other disorder: Secondary | ICD-10-CM | POA: Diagnosis not present

## 2017-04-10 DIAGNOSIS — D519 Vitamin B12 deficiency anemia, unspecified: Secondary | ICD-10-CM | POA: Diagnosis not present

## 2017-04-10 DIAGNOSIS — Z9181 History of falling: Secondary | ICD-10-CM | POA: Diagnosis not present

## 2017-04-10 DIAGNOSIS — E785 Hyperlipidemia, unspecified: Secondary | ICD-10-CM | POA: Diagnosis not present

## 2017-04-10 DIAGNOSIS — Z6832 Body mass index (BMI) 32.0-32.9, adult: Secondary | ICD-10-CM | POA: Diagnosis not present

## 2017-04-10 DIAGNOSIS — Z0000 Encounter for general adult medical examination without abnormal findings: Secondary | ICD-10-CM | POA: Diagnosis not present

## 2017-04-10 DIAGNOSIS — Z125 Encounter for screening for malignant neoplasm of prostate: Secondary | ICD-10-CM | POA: Diagnosis not present

## 2017-04-18 DIAGNOSIS — K5730 Diverticulosis of large intestine without perforation or abscess without bleeding: Secondary | ICD-10-CM | POA: Diagnosis not present

## 2017-05-07 DIAGNOSIS — R339 Retention of urine, unspecified: Secondary | ICD-10-CM | POA: Diagnosis not present

## 2017-05-11 DIAGNOSIS — K5730 Diverticulosis of large intestine without perforation or abscess without bleeding: Secondary | ICD-10-CM | POA: Diagnosis not present

## 2017-05-11 DIAGNOSIS — Z79899 Other long term (current) drug therapy: Secondary | ICD-10-CM | POA: Diagnosis not present

## 2017-05-11 DIAGNOSIS — I10 Essential (primary) hypertension: Secondary | ICD-10-CM | POA: Diagnosis not present

## 2017-05-11 DIAGNOSIS — Z8673 Personal history of transient ischemic attack (TIA), and cerebral infarction without residual deficits: Secondary | ICD-10-CM | POA: Diagnosis not present

## 2017-05-11 DIAGNOSIS — Z1211 Encounter for screening for malignant neoplasm of colon: Secondary | ICD-10-CM | POA: Diagnosis not present

## 2017-05-11 DIAGNOSIS — K648 Other hemorrhoids: Secondary | ICD-10-CM | POA: Diagnosis not present

## 2017-05-11 DIAGNOSIS — Z7982 Long term (current) use of aspirin: Secondary | ICD-10-CM | POA: Diagnosis not present

## 2017-05-16 DIAGNOSIS — D519 Vitamin B12 deficiency anemia, unspecified: Secondary | ICD-10-CM | POA: Diagnosis not present

## 2017-05-28 DIAGNOSIS — R339 Retention of urine, unspecified: Secondary | ICD-10-CM | POA: Diagnosis not present

## 2017-06-19 DIAGNOSIS — D519 Vitamin B12 deficiency anemia, unspecified: Secondary | ICD-10-CM | POA: Diagnosis not present

## 2017-06-27 ENCOUNTER — Ambulatory Visit: Payer: PPO | Admitting: Neurology

## 2017-06-27 ENCOUNTER — Encounter: Payer: Self-pay | Admitting: Neurology

## 2017-06-27 VITALS — BP 150/77 | HR 69 | Ht 68.0 in | Wt 217.5 lb

## 2017-06-27 DIAGNOSIS — G9581 Conus medullaris syndrome: Secondary | ICD-10-CM | POA: Diagnosis not present

## 2017-06-27 DIAGNOSIS — R339 Retention of urine, unspecified: Secondary | ICD-10-CM | POA: Diagnosis not present

## 2017-06-27 NOTE — Progress Notes (Signed)
Reason for visit: Conus medullaris syndrome  [name] is an 66 y.o. male  History of present illness:  [name] is a 66 year old right-handed white male with a history of onset of a saddle distribution sensory alteration and a hypotonic bladder that began in March 2018.  MRI showed increased signal in the conus medullaris.  The patient has undergone blood work that shows evidence of a elevated sedimentation rate and a positive rheumatoid factor with a negative CCP.  The patient underwent lumbar puncture that was unremarkable with exception of a mildly elevated protein level.  Over time, the patient has improved with the symptoms, he has some residual numbness in the buttocks area, but the numbness down the legs has gotten better.  He now has some burning sensations in the legs that is not severe, he does not wish to have medication for this.  He is able to sleep well at night.  He has good strength in the lower extremities, his foot drops have improved.  He does have problems with emptying his bladder, he still requires in and out catheterizations every 3-5 hours while awake.  The patient returns to this office for an evaluation.  Past Medical History:  Diagnosis Date  . Arthritis   . Carotid artery occlusion   . 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 Hyperlipidemia   . Hypertension   . Stroke [facility](HCC)    posterior circulation    History reviewed. No pertinent surgical history.  Family History  Problem Relation Age of Onset  . Hypertension Mother   . Stroke Mother   . Coronary artery disease Mother     Social history:  reports that he quit smoking about 16 years ago. His smoking use included cigarettes. he has never used smokeless tobacco. He reports that he does not drink alcohol or use drugs.    Allergies  Allergen Reactions  . Penicillins Other (See Comments)    Feet peel Has patient had a PCN reaction causing immediate rash, facial/tongue/throat swelling, SOB or  lightheadedness with hypotension: Yes Has patient had a PCN reaction causing severe rash involving mucus membranes or skin necrosis: No Has patient had a PCN reaction that required hospitalization No Has patient had a PCN reaction occurring within the last 10 years: No If all of the above answers are "NO", then may proceed with Cephalosporin use.     Medications:  Prior to Admission medications   Medication Sig Start Date End Date Taking? Authorizing Provider  acetaminophen (TYLENOL) 325 MG tablet Take 650 mg by mouth every 6 (six) hours as needed (for pain).   Yes [provider]  aspirin EC 325 MG tablet Take 325 mg by mouth daily.   Yes [provider]  lisinopril (PRINIVIL,ZESTRIL) 20 MG tablet Take 20 mg by mouth 2 (two) times daily.   Yes [provider]  simvastatin (ZOCOR) 80 MG tablet Take 80 mg by mouth at bedtime.   Yes [provider]    ROS:  Out of a complete 14 system review of symptoms, the patient complains only of the following symptoms, and all other reviewed systems are negative.  Leg swelling Difficulty urinating  Blood pressure (!) 150/77, pulse 69, height 5\' 8"  (1.727 m), weight 217 lb 8 oz (98.7 kg).  Physical Exam  General: The patient is alert and cooperative at the time of the examination.  The patient is moderately obese.  Skin: No significant peripheral edema is noted.   Neurologic Exam  Mental status: The patient is alert and oriented x 3 at the time of the examination. The patient has apparent normal recent and remote memory, with an apparently normal attention span and concentration ability.   Cranial nerves: Facial symmetry is present. Speech is normal, no aphasia or dysarthria is noted. Extraocular movements are full. Visual fields are full.  Motor: The patient has good strength in all 4 extremities.  Sensory examination: Soft touch sensation is symmetric on the face, arms, and legs.  Coordination: The  patient has good finger-nose-finger and heel-to-shin bilaterally.  Gait and station: The patient has a normal gait. Tandem gait is normal. Romberg is negative. No drift is seen.  The patient is able to walk on heels and the toes bilaterally.  Reflexes: Deep tendon reflexes are symmetric.   Assessment/Plan:  1.  Transverse myelitis, conus medullaris syndrome  2.  Neurogenic bladder, hypotonic bladder  The patient has improved significantly with his lower extremity symptoms, he continues to have a hypotonic bladder requiring in and out catheterizations.  The patient likely had a transverse myelitis, blood work has shown an elevated rheumatoid factor, the patient reports no other arthritis symptoms.  We will repeat the blood work today, we may consider a rheumatology referral in the future.  [name]. Sean Burhanuddin Kohlmann MD 06/27/2017 9:17 AM  [facility] [address] [place], [place] [id number]  Phone [phone] Fax [phone]

## 2017-06-28 ENCOUNTER — Telehealth: Payer: Self-pay | Admitting: Neurology

## 2017-06-28 LAB — SEDIMENTATION RATE: SED RATE: 7 mm/h (ref 0–30)

## 2017-06-28 LAB — RHEUMATOID FACTOR: Rhuematoid fact SerPl-aCnc: 22 IU/mL — ABNORMAL HIGH (ref 0.0–13.9)

## 2017-06-28 LAB — C-REACTIVE PROTEIN: CRP: 3.6 mg/L (ref 0.0–4.9)

## 2017-06-28 NOTE — Telephone Encounter (Signed)
I called the patient.  The blood work reveals an elevated rheumatoid factor, but this is improved from what was done several months ago, the sedimentation rate and C-reactive protein are now with well within normal limits.  We will hold off on a rheumatology referral for now.

## 2017-07-05 DIAGNOSIS — R339 Retention of urine, unspecified: Secondary | ICD-10-CM | POA: Diagnosis not present

## 2017-07-18 DIAGNOSIS — R339 Retention of urine, unspecified: Secondary | ICD-10-CM | POA: Diagnosis not present

## 2017-07-18 DIAGNOSIS — R972 Elevated prostate specific antigen [PSA]: Secondary | ICD-10-CM | POA: Diagnosis not present

## 2017-07-18 DIAGNOSIS — N319 Neuromuscular dysfunction of bladder, unspecified: Secondary | ICD-10-CM | POA: Diagnosis not present

## 2017-07-24 DIAGNOSIS — D519 Vitamin B12 deficiency anemia, unspecified: Secondary | ICD-10-CM | POA: Diagnosis not present

## 2017-07-27 DIAGNOSIS — R339 Retention of urine, unspecified: Secondary | ICD-10-CM | POA: Diagnosis not present

## 2017-08-01 DIAGNOSIS — N319 Neuromuscular dysfunction of bladder, unspecified: Secondary | ICD-10-CM | POA: Diagnosis not present

## 2017-08-08 DIAGNOSIS — R972 Elevated prostate specific antigen [PSA]: Secondary | ICD-10-CM | POA: Diagnosis not present

## 2017-08-08 DIAGNOSIS — R339 Retention of urine, unspecified: Secondary | ICD-10-CM | POA: Diagnosis not present

## 2017-08-08 DIAGNOSIS — N3942 Incontinence without sensory awareness: Secondary | ICD-10-CM | POA: Diagnosis not present

## 2017-08-08 DIAGNOSIS — N3941 Urge incontinence: Secondary | ICD-10-CM | POA: Diagnosis not present

## 2017-08-08 DIAGNOSIS — N319 Neuromuscular dysfunction of bladder, unspecified: Secondary | ICD-10-CM | POA: Diagnosis not present

## 2017-08-24 DIAGNOSIS — R339 Retention of urine, unspecified: Secondary | ICD-10-CM | POA: Diagnosis not present

## 2017-08-29 DIAGNOSIS — D519 Vitamin B12 deficiency anemia, unspecified: Secondary | ICD-10-CM | POA: Diagnosis not present

## 2017-08-31 DIAGNOSIS — R972 Elevated prostate specific antigen [PSA]: Secondary | ICD-10-CM | POA: Diagnosis not present

## 2017-09-17 IMAGING — MR MR LUMBAR SPINE W/O CM
4 of 5 series · 18 of 48 positions shown · non-contrast
Comparison: MRI lumbar spine 09/26/2016

CLINICAL DATA: Bilateral lower extremity weakness. Unable to
weight. Bilateral lower extremity tingling and pain.

EXAM:
MRI LUMBAR SPINE WITHOUT CONTRAST
TECHNIQUE: Multiplanar, multisequence MR imaging of the lumbar spine was
performed. No intravenous contrast was administered.

[Series 4: T2 · sagittal · 4.0mm · 0.55mm/px · 7 of 15 slices shown (1 of 2)]
[im 1/15]
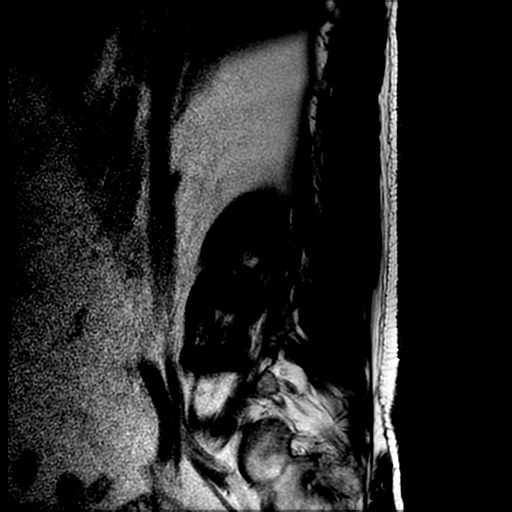
[im 3/15]
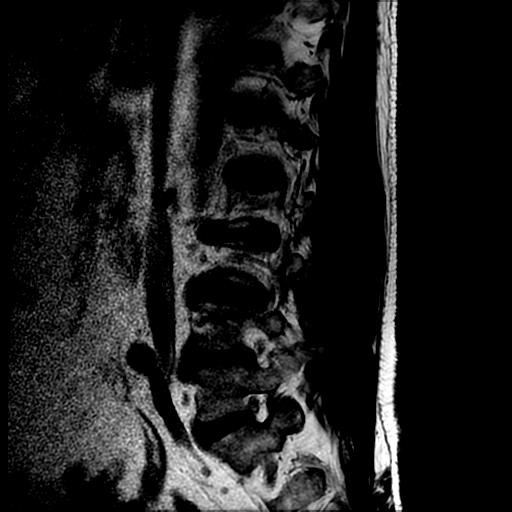
[im 5/15]
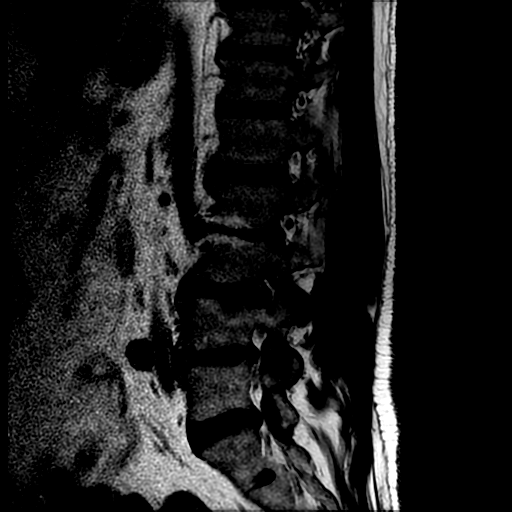
[im 8/15]
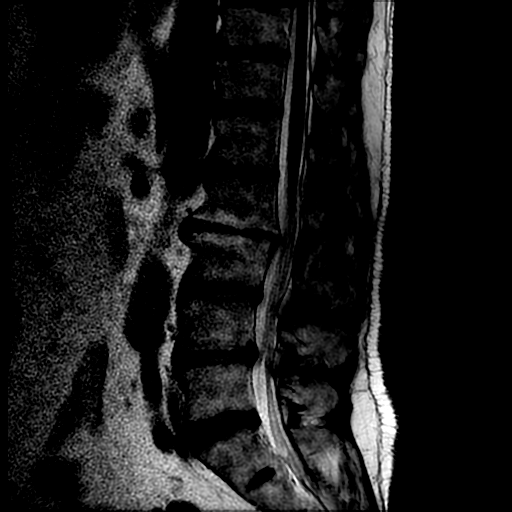
[im 10/15]
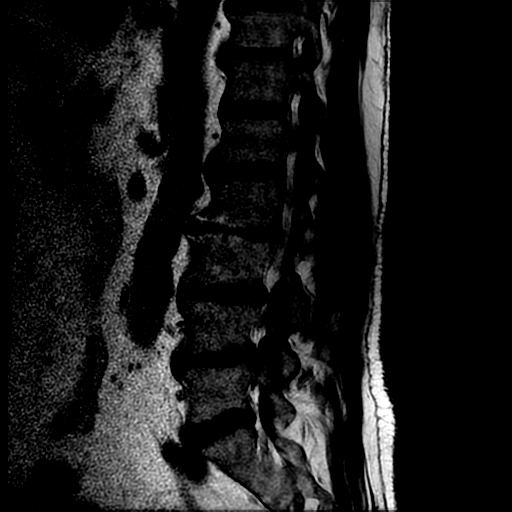
[im 12/15]
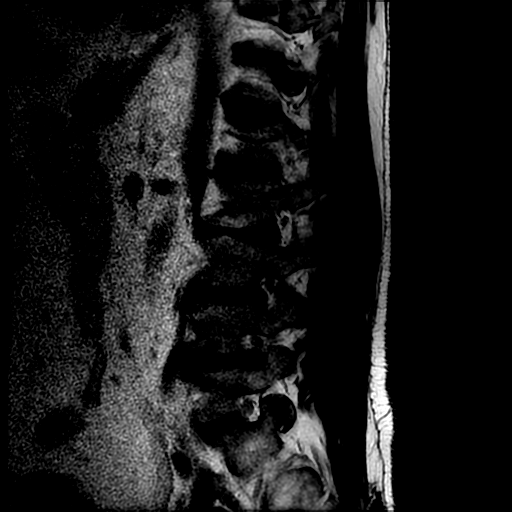
[im 15/15]
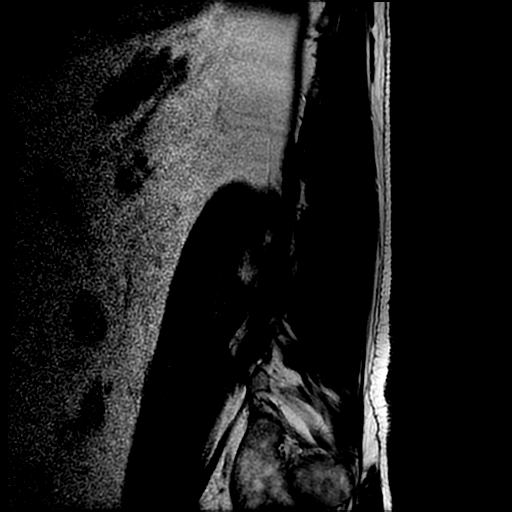

[Series 5: T1 · sagittal · 4.0mm · 0.55mm/px · 3 of 15 slices shown (1 of 2)]
[im 3/15]
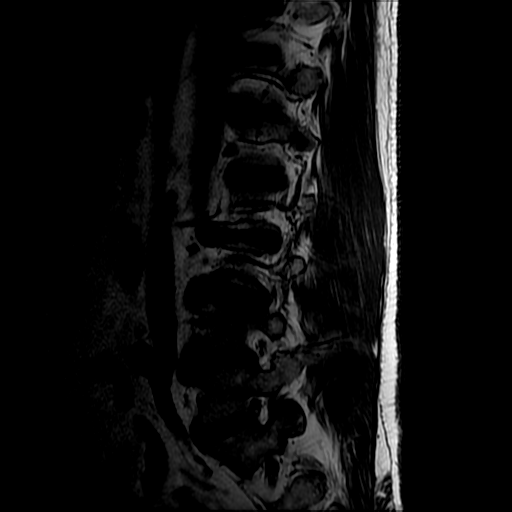
[im 8/15]
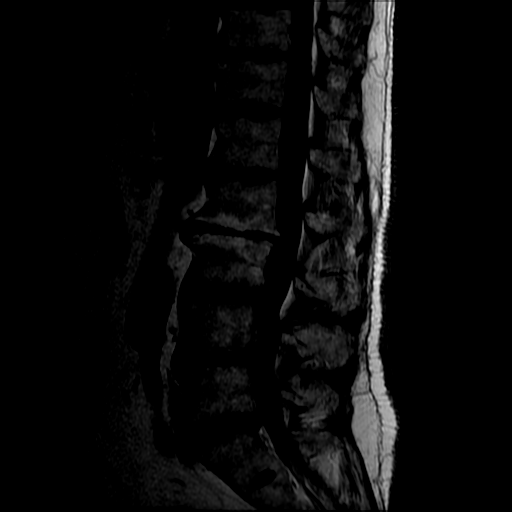
[im 12/15]
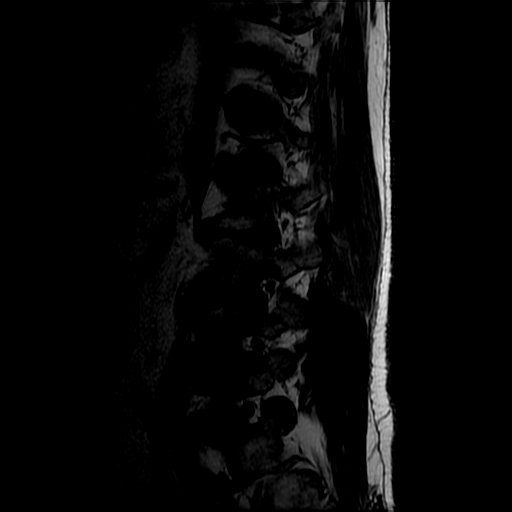

[Series 6: T2 · axial · 4.0mm · 0.39mm/px · z∈[-140,+10]mm · 5 of 34 slices shown (2 of 2)]
[im 1/34]
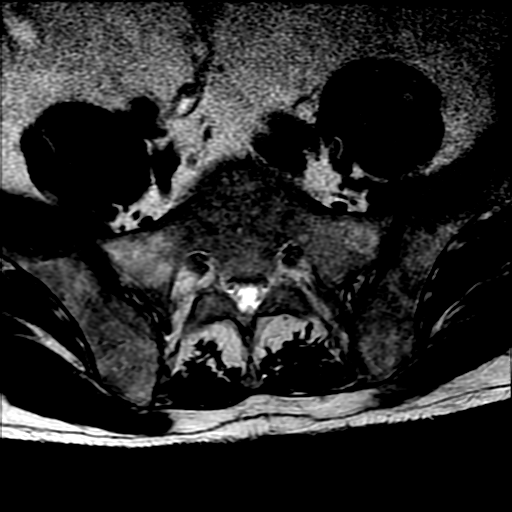
[im 6/34]
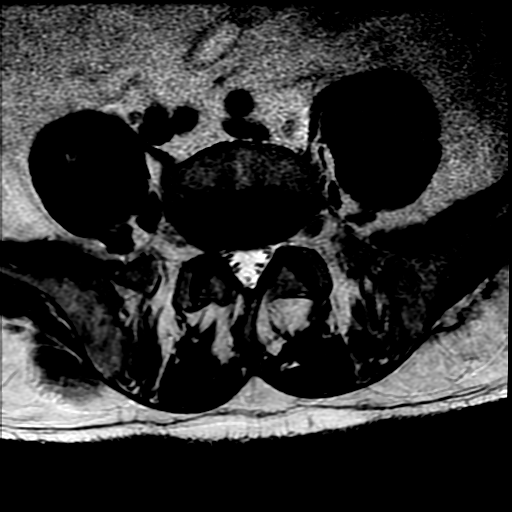
[im 11/34]
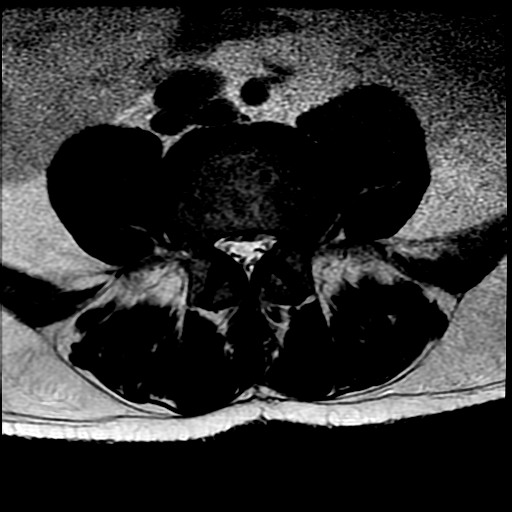
[im 18/34]
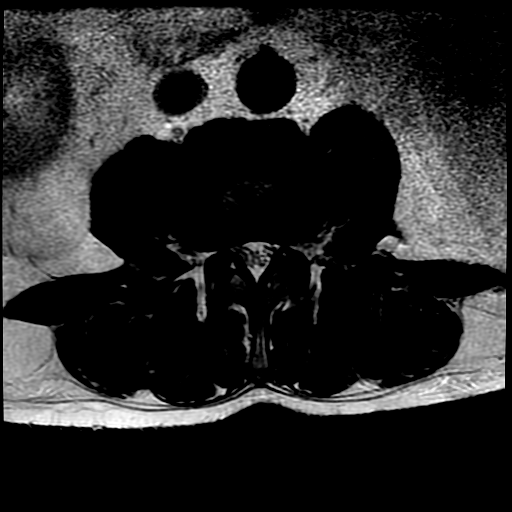
[im 28/34]
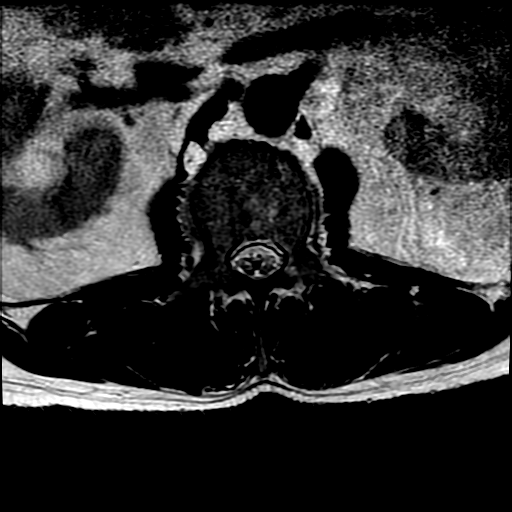

[Series 8: T1 · axial · 4.0mm · 0.39mm/px · z∈[-115,+10]mm · 3 of 34 slices shown (2 of 2)]
[im 6/34]
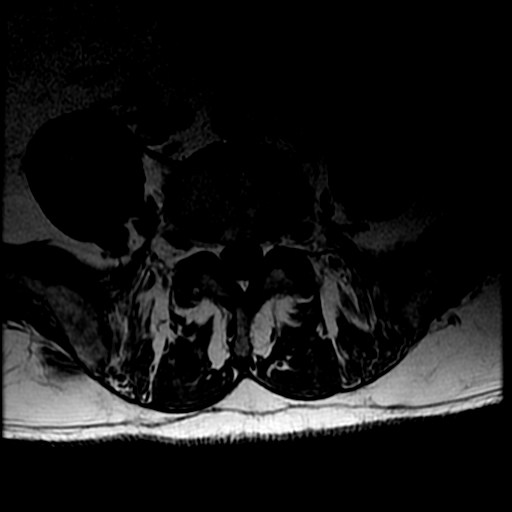
[im 18/34]
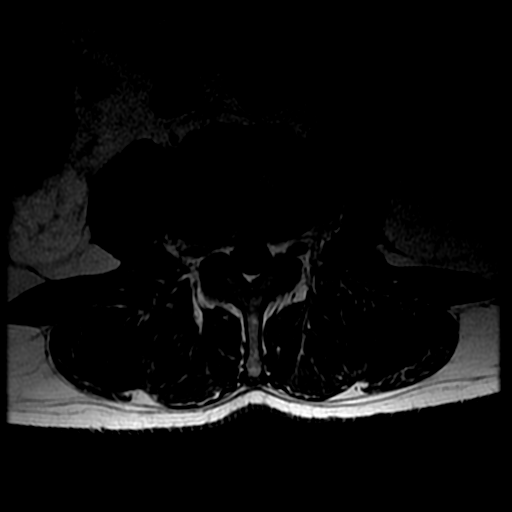
[im 28/34]
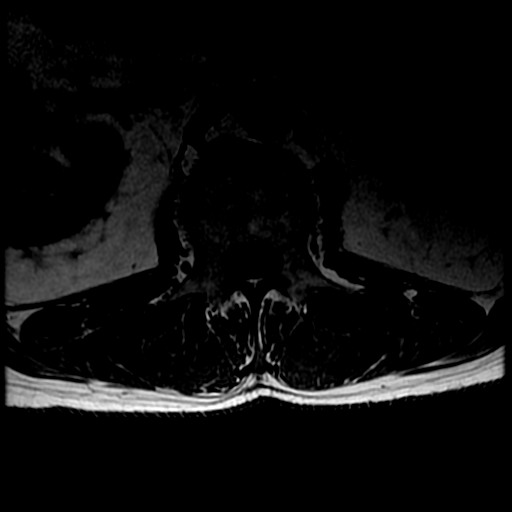

[18 of 48 positions shown; findings below may reference images not displayed]

FINDINGS: Segmentation: 5 non rib-bearing lumbar type vertebral bodies are
present.

Alignment: There straightening of the normal cervical lordosis.
Leftward curvature of the lumbar spine is centered at L3.

Vertebrae: Chronic endplate marrow changes are more prominent right
than left at L2-3. Chronic endplate changes are also noted on the
right at L4-5 with a superior endplate Schmorl's node at L4. Marrow
signal and vertebral body heights are otherwise normal.

Conus medullaris: Extends to the L2 level and appears normal.

Paraspinal and other soft tissues: Limited imaging of the abdomen is
unremarkable. There is no significant adenopathy.

Disc levels:

L1-2:  Negative.

L2-3: A right paramedian disc protrusion has progressed. Mild right
subarticular narrowing is now present. The foramina remain patent.

L3-4: A broad-based disc protrusion is present. Mild facet
hypertrophy is noted bilaterally. Moderate right and mild left
subarticular narrowing is present bilaterally without significant
change. Mild foraminal narrowing is present bilaterally.

L4-5: A broad-based disc protrusion is present. A far left annular
tear is again noted. Moderate bilateral subarticular and left
foraminal narrowing are similar to the prior study. Mild right
foraminal narrowing is present.

L5-S1: A rightward disc protrusion and annular tear are again seen.
Mild right subarticular narrowing is present. The foramina are
patent.
IMPRESSION: 1. Slight progression in multilevel spondylosis of the lumbar spine.
2. Mild right subarticular narrowing at L2-3 is new.
3. Stable moderate right and mild left subarticular narrowing at
L3-4 with mild bilateral foraminal stenosis.
4. Stable moderate bilateral subarticular and left foraminal
stenosis at L4-5 secondary to a broad-based disc protrusion with a
far left annular tear.
5. Mild right foraminal narrowing at L4-5 is stable.
6. Rightward disc protrusion and annular tear at L5-S1 with mild
right subarticular narrowing.

## 2017-09-24 DIAGNOSIS — R339 Retention of urine, unspecified: Secondary | ICD-10-CM | POA: Diagnosis not present

## 2017-10-03 DIAGNOSIS — D519 Vitamin B12 deficiency anemia, unspecified: Secondary | ICD-10-CM | POA: Diagnosis not present

## 2017-10-24 DIAGNOSIS — R339 Retention of urine, unspecified: Secondary | ICD-10-CM | POA: Diagnosis not present

## 2017-11-06 DIAGNOSIS — D519 Vitamin B12 deficiency anemia, unspecified: Secondary | ICD-10-CM | POA: Diagnosis not present

## 2017-11-23 DIAGNOSIS — R339 Retention of urine, unspecified: Secondary | ICD-10-CM | POA: Diagnosis not present

## 2017-12-11 DIAGNOSIS — N319 Neuromuscular dysfunction of bladder, unspecified: Secondary | ICD-10-CM | POA: Diagnosis not present

## 2017-12-11 DIAGNOSIS — R339 Retention of urine, unspecified: Secondary | ICD-10-CM | POA: Diagnosis not present

## 2017-12-11 DIAGNOSIS — R972 Elevated prostate specific antigen [PSA]: Secondary | ICD-10-CM | POA: Diagnosis not present

## 2017-12-12 DIAGNOSIS — D519 Vitamin B12 deficiency anemia, unspecified: Secondary | ICD-10-CM | POA: Diagnosis not present

## 2017-12-24 DIAGNOSIS — R339 Retention of urine, unspecified: Secondary | ICD-10-CM | POA: Diagnosis not present

## 2017-12-26 ENCOUNTER — Ambulatory Visit: Payer: PPO | Admitting: Neurology

## 2017-12-26 ENCOUNTER — Encounter: Payer: Self-pay | Admitting: Neurology

## 2017-12-26 VITALS — BP 130/78 | HR 56 | Ht 68.0 in | Wt 208.0 lb

## 2017-12-26 DIAGNOSIS — E538 Deficiency of other specified B group vitamins: Secondary | ICD-10-CM | POA: Diagnosis not present

## 2017-12-26 DIAGNOSIS — G9581 Conus medullaris syndrome: Secondary | ICD-10-CM

## 2017-12-26 DIAGNOSIS — N319 Neuromuscular dysfunction of bladder, unspecified: Secondary | ICD-10-CM | POA: Insufficient documentation

## 2017-12-26 HISTORY — DX: Neuromuscular dysfunction of bladder, unspecified: N31.9

## 2017-12-26 HISTORY — DX: Deficiency of other specified B group vitamins: E53.8

## 2017-12-26 NOTE — Progress Notes (Signed)
Reason for visit: Transverse myelitis  [name] is an 67 y.o. male  History of present illness:  [name] is a 67 year old right-handed white male with a history of a transverse myelitis associated with involvement of the conus medullaris, the patient has a saddle distribution sensory alteration and bladder dysfunction with a hypotonic bladder.  The patient still requires in and out catheterizations every 4 hours, he indicates that he is starting to be able to void some on his own.  The patient has had gradual improvement in his symptoms, he denies any weakness of the legs or difficulty with balance.  He has no problems with numbness of the arms or hands.  He reports no new symptoms since last seen.  He returns to this office for an evaluation.  Prior evaluations have included spinal fluid analysis, MRI of the cervical, thoracic, and lumbar spine.  Occasionally, he may note some numbness in the feet when he is resting, he does not notice this when he is up on his feet.  Past Medical History:  Diagnosis Date  . Arthritis   . Carotid artery occlusion   . 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 Hyperlipidemia   . Hypertension   . Stroke [facility])    posterior circulation  . Vitamin B12 deficiency 12/26/2017    History reviewed. No pertinent surgical history.  Family History  Problem Relation Age of Onset  . Hypertension Mother   . Stroke Mother   . Coronary artery disease Mother     Social history:  reports that he quit smoking about 17 years ago. His smoking use included cigarettes. He has never used smokeless tobacco. He reports that he does not drink alcohol or use drugs.    Allergies  Allergen Reactions  . Penicillins Other (See Comments)    Feet peel Has patient had a PCN reaction causing immediate rash, facial/tongue/throat swelling, SOB or lightheadedness with hypotension: Yes Has patient had a PCN reaction causing severe rash involving mucus membranes or skin  necrosis: No Has patient had a PCN reaction that required hospitalization No Has patient had a PCN reaction occurring within the last 10 years: No If all of the above answers are "NO", then may proceed with Cephalosporin use.     Medications:  Prior to Admission medications   Medication Sig Start Date End Date Taking? Authorizing Provider  acetaminophen (TYLENOL) 325 MG tablet Take 650 mg by mouth every 6 (six) hours as needed (for pain).   Yes [provider]  aspirin EC 325 MG tablet Take 325 mg by mouth daily.   Yes [provider]  cyanocobalamin (,VITAMIN B-12,) 1000 MCG/ML injection Inject 1,000 mcg into the muscle every 30 (thirty) days.   Yes [provider]  lisinopril (PRINIVIL,ZESTRIL) 20 MG tablet Take 20 mg by mouth 2 (two) times daily.   Yes [provider]  silodosin (RAPAFLO) 8 MG CAPS capsule Take 8 mg by mouth daily with breakfast.   Yes [provider]  simvastatin (ZOCOR) 80 MG tablet Take 80 mg by mouth at bedtime.   Yes [provider]    ROS:  Out of a complete 14 system review of symptoms, the patient complains only of the following symptoms, and all other reviewed systems are negative.  Numbness Bladder problems  Blood pressure 130/78, pulse (!) 56, height 5\' 8"  (1.727 m), weight 208 lb (94.3 kg).  Physical Exam  General: The patient is alert and cooperative at the time of  the examination.  The patient is moderately obese.  Skin: No significant peripheral edema is noted.   Neurologic Exam  Mental status: The patient is alert and oriented x 3 at the time of the examination. The patient has apparent normal recent and remote memory, with an apparently normal attention span and concentration ability.   Cranial nerves: Facial symmetry is present. Speech is normal, no aphasia or dysarthria is noted. Extraocular movements are full. Visual fields are full.  Motor: The patient has good strength in all 4  extremities.  Sensory examination: Soft touch sensation is symmetric on the face, arms, and legs.  Coordination: The patient has good finger-nose-finger and heel-to-shin bilaterally.  Gait and station: The patient has a normal gait. Tandem gait is normal. Romberg is negative. No drift is seen.  The patient is able to walk on heels and the toes bilaterally.  Reflexes: Deep tendon reflexes are symmetric.   Assessment/Plan:  1.  Transverse myelitis  2.  Vitamin B12 deficiency  3.  Neurogenic bladder, hypotonic bladder  The patient continues on vitamin B12 shots once a month.  Currently, the patient is not getting any medications through this office.  He appears to be stable, gradually improving over time.  He is likely to have permanent issues with his neurogenic bladder.  He will follow-up through this office on an as-needed basis.  Marlan Palau. Keith Lorianna Spadaccini MD 12/26/2017 9:14 AM  [facility] [address] [place], [place] [address]  Phone [phone] Fax [phone]

## 2018-01-16 DIAGNOSIS — D519 Vitamin B12 deficiency anemia, unspecified: Secondary | ICD-10-CM | POA: Diagnosis not present

## 2018-01-22 DIAGNOSIS — N319 Neuromuscular dysfunction of bladder, unspecified: Secondary | ICD-10-CM | POA: Diagnosis not present

## 2018-01-22 DIAGNOSIS — R339 Retention of urine, unspecified: Secondary | ICD-10-CM | POA: Diagnosis not present

## 2018-01-22 DIAGNOSIS — R972 Elevated prostate specific antigen [PSA]: Secondary | ICD-10-CM | POA: Diagnosis not present

## 2018-01-23 DIAGNOSIS — R339 Retention of urine, unspecified: Secondary | ICD-10-CM | POA: Diagnosis not present

## 2018-02-21 DIAGNOSIS — D519 Vitamin B12 deficiency anemia, unspecified: Secondary | ICD-10-CM | POA: Diagnosis not present

## 2018-02-22 DIAGNOSIS — R339 Retention of urine, unspecified: Secondary | ICD-10-CM | POA: Diagnosis not present

## 2018-03-22 DIAGNOSIS — R339 Retention of urine, unspecified: Secondary | ICD-10-CM | POA: Diagnosis not present

## 2018-03-27 DIAGNOSIS — D519 Vitamin B12 deficiency anemia, unspecified: Secondary | ICD-10-CM | POA: Diagnosis not present

## 2018-04-16 DIAGNOSIS — R972 Elevated prostate specific antigen [PSA]: Secondary | ICD-10-CM | POA: Diagnosis not present

## 2018-04-16 DIAGNOSIS — R339 Retention of urine, unspecified: Secondary | ICD-10-CM | POA: Diagnosis not present

## 2018-04-16 DIAGNOSIS — N319 Neuromuscular dysfunction of bladder, unspecified: Secondary | ICD-10-CM | POA: Diagnosis not present

## 2018-04-22 DIAGNOSIS — R339 Retention of urine, unspecified: Secondary | ICD-10-CM | POA: Diagnosis not present

## 2018-05-03 DIAGNOSIS — D519 Vitamin B12 deficiency anemia, unspecified: Secondary | ICD-10-CM | POA: Diagnosis not present

## 2018-05-03 DIAGNOSIS — E785 Hyperlipidemia, unspecified: Secondary | ICD-10-CM | POA: Diagnosis not present

## 2018-05-03 DIAGNOSIS — Z1339 Encounter for screening examination for other mental health and behavioral disorders: Secondary | ICD-10-CM | POA: Diagnosis not present

## 2018-05-03 DIAGNOSIS — Z6830 Body mass index (BMI) 30.0-30.9, adult: Secondary | ICD-10-CM | POA: Diagnosis not present

## 2018-05-03 DIAGNOSIS — Z125 Encounter for screening for malignant neoplasm of prostate: Secondary | ICD-10-CM | POA: Diagnosis not present

## 2018-05-03 DIAGNOSIS — Z9181 History of falling: Secondary | ICD-10-CM | POA: Diagnosis not present

## 2018-05-03 DIAGNOSIS — Z0000 Encounter for general adult medical examination without abnormal findings: Secondary | ICD-10-CM | POA: Diagnosis not present

## 2018-05-03 DIAGNOSIS — Z1331 Encounter for screening for depression: Secondary | ICD-10-CM | POA: Diagnosis not present

## 2018-05-03 DIAGNOSIS — E669 Obesity, unspecified: Secondary | ICD-10-CM | POA: Diagnosis not present

## 2018-05-03 DIAGNOSIS — Z139 Encounter for screening, unspecified: Secondary | ICD-10-CM | POA: Diagnosis not present

## 2018-05-14 DIAGNOSIS — R339 Retention of urine, unspecified: Secondary | ICD-10-CM | POA: Diagnosis not present

## 2018-05-15 DIAGNOSIS — E785 Hyperlipidemia, unspecified: Secondary | ICD-10-CM | POA: Diagnosis not present

## 2018-05-15 DIAGNOSIS — I639 Cerebral infarction, unspecified: Secondary | ICD-10-CM | POA: Diagnosis not present

## 2018-05-15 DIAGNOSIS — D519 Vitamin B12 deficiency anemia, unspecified: Secondary | ICD-10-CM | POA: Diagnosis not present

## 2018-05-15 DIAGNOSIS — Z23 Encounter for immunization: Secondary | ICD-10-CM | POA: Diagnosis not present

## 2018-05-15 DIAGNOSIS — I10 Essential (primary) hypertension: Secondary | ICD-10-CM | POA: Diagnosis not present

## 2018-05-15 DIAGNOSIS — Z2821 Immunization not carried out because of patient refusal: Secondary | ICD-10-CM | POA: Diagnosis not present

## 2018-05-15 DIAGNOSIS — R972 Elevated prostate specific antigen [PSA]: Secondary | ICD-10-CM | POA: Diagnosis not present

## 2018-06-12 DIAGNOSIS — R339 Retention of urine, unspecified: Secondary | ICD-10-CM | POA: Diagnosis not present

## 2018-06-19 DIAGNOSIS — D519 Vitamin B12 deficiency anemia, unspecified: Secondary | ICD-10-CM | POA: Diagnosis not present

## 2018-07-16 DIAGNOSIS — R339 Retention of urine, unspecified: Secondary | ICD-10-CM | POA: Diagnosis not present

## 2018-07-24 DIAGNOSIS — D519 Vitamin B12 deficiency anemia, unspecified: Secondary | ICD-10-CM | POA: Diagnosis not present

## 2018-08-14 DIAGNOSIS — R339 Retention of urine, unspecified: Secondary | ICD-10-CM | POA: Diagnosis not present

## 2018-09-11 DIAGNOSIS — R339 Retention of urine, unspecified: Secondary | ICD-10-CM | POA: Diagnosis not present

## 2018-10-01 DIAGNOSIS — R339 Retention of urine, unspecified: Secondary | ICD-10-CM | POA: Diagnosis not present

## 2018-10-30 DIAGNOSIS — R339 Retention of urine, unspecified: Secondary | ICD-10-CM | POA: Diagnosis not present

## 2018-11-27 DIAGNOSIS — R339 Retention of urine, unspecified: Secondary | ICD-10-CM | POA: Diagnosis not present

## 2018-12-27 DIAGNOSIS — R339 Retention of urine, unspecified: Secondary | ICD-10-CM | POA: Diagnosis not present

## 2019-01-08 DIAGNOSIS — R7301 Impaired fasting glucose: Secondary | ICD-10-CM | POA: Diagnosis not present

## 2019-01-08 DIAGNOSIS — I6990 Unspecified sequelae of unspecified cerebrovascular disease: Secondary | ICD-10-CM | POA: Diagnosis not present

## 2019-01-08 DIAGNOSIS — D519 Vitamin B12 deficiency anemia, unspecified: Secondary | ICD-10-CM | POA: Diagnosis not present

## 2019-01-08 DIAGNOSIS — I10 Essential (primary) hypertension: Secondary | ICD-10-CM | POA: Diagnosis not present

## 2019-01-08 DIAGNOSIS — E785 Hyperlipidemia, unspecified: Secondary | ICD-10-CM | POA: Diagnosis not present

## 2019-01-08 DIAGNOSIS — R972 Elevated prostate specific antigen [PSA]: Secondary | ICD-10-CM | POA: Diagnosis not present

## 2019-01-28 DIAGNOSIS — R339 Retention of urine, unspecified: Secondary | ICD-10-CM | POA: Diagnosis not present

## 2019-02-12 DIAGNOSIS — D519 Vitamin B12 deficiency anemia, unspecified: Secondary | ICD-10-CM | POA: Diagnosis not present

## 2019-02-26 DIAGNOSIS — R339 Retention of urine, unspecified: Secondary | ICD-10-CM | POA: Diagnosis not present

## 2019-03-28 DIAGNOSIS — R339 Retention of urine, unspecified: Secondary | ICD-10-CM | POA: Diagnosis not present

## 2019-04-29 DIAGNOSIS — R339 Retention of urine, unspecified: Secondary | ICD-10-CM | POA: Diagnosis not present

## 2019-05-19 DIAGNOSIS — Z2821 Immunization not carried out because of patient refusal: Secondary | ICD-10-CM | POA: Diagnosis not present

## 2019-05-19 DIAGNOSIS — Z9181 History of falling: Secondary | ICD-10-CM | POA: Diagnosis not present

## 2019-05-19 DIAGNOSIS — Z125 Encounter for screening for malignant neoplasm of prostate: Secondary | ICD-10-CM | POA: Diagnosis not present

## 2019-05-19 DIAGNOSIS — Z6833 Body mass index (BMI) 33.0-33.9, adult: Secondary | ICD-10-CM | POA: Diagnosis not present

## 2019-05-19 DIAGNOSIS — Z0000 Encounter for general adult medical examination without abnormal findings: Secondary | ICD-10-CM | POA: Diagnosis not present

## 2019-05-19 DIAGNOSIS — E669 Obesity, unspecified: Secondary | ICD-10-CM | POA: Diagnosis not present

## 2019-05-19 DIAGNOSIS — Z136 Encounter for screening for cardiovascular disorders: Secondary | ICD-10-CM | POA: Diagnosis not present

## 2019-05-19 DIAGNOSIS — D519 Vitamin B12 deficiency anemia, unspecified: Secondary | ICD-10-CM | POA: Diagnosis not present

## 2019-05-19 DIAGNOSIS — Z139 Encounter for screening, unspecified: Secondary | ICD-10-CM | POA: Diagnosis not present

## 2019-05-19 DIAGNOSIS — E785 Hyperlipidemia, unspecified: Secondary | ICD-10-CM | POA: Diagnosis not present

## 2019-05-19 DIAGNOSIS — Z1331 Encounter for screening for depression: Secondary | ICD-10-CM | POA: Diagnosis not present

## 2019-05-28 DIAGNOSIS — R339 Retention of urine, unspecified: Secondary | ICD-10-CM | POA: Diagnosis not present

## 2019-06-25 DIAGNOSIS — R339 Retention of urine, unspecified: Secondary | ICD-10-CM | POA: Diagnosis not present

## 2019-07-25 DIAGNOSIS — R339 Retention of urine, unspecified: Secondary | ICD-10-CM | POA: Diagnosis not present

## 2019-08-22 DIAGNOSIS — R339 Retention of urine, unspecified: Secondary | ICD-10-CM | POA: Diagnosis not present

## 2019-09-19 DIAGNOSIS — R339 Retention of urine, unspecified: Secondary | ICD-10-CM | POA: Diagnosis not present

## 2019-10-21 DIAGNOSIS — R339 Retention of urine, unspecified: Secondary | ICD-10-CM | POA: Diagnosis not present

## 2019-11-18 DIAGNOSIS — R339 Retention of urine, unspecified: Secondary | ICD-10-CM | POA: Diagnosis not present

## 2019-12-18 DIAGNOSIS — R339 Retention of urine, unspecified: Secondary | ICD-10-CM | POA: Diagnosis not present

## 2020-01-19 DIAGNOSIS — R339 Retention of urine, unspecified: Secondary | ICD-10-CM | POA: Diagnosis not present

## 2020-01-27 ENCOUNTER — Other Ambulatory Visit: Payer: Self-pay

## 2020-02-17 DIAGNOSIS — R339 Retention of urine, unspecified: Secondary | ICD-10-CM | POA: Diagnosis not present

## 2020-03-17 DIAGNOSIS — R339 Retention of urine, unspecified: Secondary | ICD-10-CM | POA: Diagnosis not present

## 2020-04-05 ENCOUNTER — Encounter: Payer: Self-pay | Admitting: Legal Medicine

## 2020-04-05 ENCOUNTER — Ambulatory Visit (INDEPENDENT_AMBULATORY_CARE_PROVIDER_SITE_OTHER): Payer: Self-pay | Admitting: Legal Medicine

## 2020-04-05 ENCOUNTER — Other Ambulatory Visit: Payer: Self-pay

## 2020-04-05 DIAGNOSIS — Z0000 Encounter for general adult medical examination without abnormal findings: Secondary | ICD-10-CM | POA: Insufficient documentation

## 2020-04-05 NOTE — Progress Notes (Signed)
Established Patient Office Visit  Subjective:  Patient ID: [name], male    DOB: [birth year]  Age: 69 y.o. MRN: [id number]  CC:  Chief Complaint  Patient presents with  . DOT    HPI [name] presents for DOT physical.  Past Medical History:  Diagnosis Date  . Arthritis   . Carotid artery occlusion   . 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 Hyperlipidemia   . Hypertension   . Neurogenic bladder 12/26/2017   Hypotonic bladder  . Stroke [facility])    posterior circulation  . Vitamin B12 deficiency 12/26/2017    History reviewed. No pertinent surgical history.  Family History  Problem Relation Age of Onset  . Hypertension Mother   . Stroke Mother   . Coronary artery disease Mother     Social History   Socioeconomic History  . Marital status: Married    Spouse name: Not on file  . Number of children: 1  . Years of education: 57  . Highest education level: Not on file  Occupational History  . Occupation: [employer] doctor  Tobacco Use  . Smoking status: Former Smoker    Types: Cigarettes    Quit date: 07/03/2000    Years since quitting: 19.7  . Smokeless tobacco: Never Used  Vaping Use  . Vaping Use: Never used  Substance and Sexual Activity  . Alcohol use: No  . Drug use: No  . Sexual activity: Not on file  Other Topics Concern  . Not on file  Social History Narrative   Lives with girlfriend   Caffeine use: 2 cup coffee per day   Right handed   Social Determinants of Health   Financial Resource Strain:   . Difficulty of Paying Living Expenses: Not on file  Food Insecurity:   . Worried About Programme researcher, broadcasting/film/video in the Last Year: Not on file  . Ran Out of Food in the Last Year: Not on file  Transportation Needs:   . Lack of Transportation (Medical): Not on file  . Lack of Transportation (Non-Medical): Not on file  Physical Activity:   . Days of Exercise per Week: Not on file  . Minutes of Exercise per Session: Not on file  Stress:   . Feeling  of Stress : Not on file  Social Connections:   . Frequency of Communication with Friends and Family: Not on file  . Frequency of Social Gatherings with Friends and Family: Not on file  . Attends Religious Services: Not on file  . Active Member of Clubs or Organizations: Not on file  . Attends Banker Meetings: Not on file  . Marital Status: Not on file  Intimate Partner Violence:   . Fear of Current or Ex-Partner: Not on file  . Emotionally Abused: Not on file  . Physically Abused: Not on file  . Sexually Abused: Not on file    Outpatient Medications Prior to Visit  Medication Sig Dispense Refill  . acetaminophen (TYLENOL) 325 MG tablet Take 650 mg by mouth every 6 (six) hours as needed (for pain).    Marland Kitchen alfuzosin (UROXATRAL) 10 MG 24 hr tablet Take by mouth.    Marland Kitchen aspirin EC 325 MG tablet Take 325 mg by mouth daily.    . cyanocobalamin (,VITAMIN B-12,) 1000 MCG/ML injection Inject 1,000 mcg into the muscle every 30 (thirty) days.    Marland Kitchen lisinopril (PRINIVIL,ZESTRIL) 20 MG tablet Take 20 mg by mouth 2 (two) times daily.    Marland Kitchen  Multiple Vitamins-Minerals (MULTIVITAMIN WITH MINERALS) tablet Take by mouth.    . silodosin (RAPAFLO) 8 MG CAPS capsule Take 8 mg by mouth daily with breakfast.    . simvastatin (ZOCOR) 80 MG tablet Take 80 mg by mouth at bedtime.    . Cyanocobalamin (VITAMIN B-12 IJ) Inject as directed See admin instructions.     No facility-administered medications prior to visit.    Allergies  Allergen Reactions  . Penicillins Other (See Comments)    Feet peel Has patient had a PCN reaction causing immediate rash, facial/tongue/throat swelling, SOB or lightheadedness with hypotension: Yes Has patient had a PCN reaction causing severe rash involving mucus membranes or skin necrosis: No Has patient had a PCN reaction that required hospitalization No Has patient had a PCN reaction occurring within the last 10 years: No If all of the above answers are "NO", then may  proceed with Cephalosporin use.     ROS Review of Systems  Constitutional: Negative for activity change and appetite change.  HENT: Negative.   Eyes: Negative.   Respiratory: Negative for cough, shortness of breath and stridor.   Cardiovascular: Negative for chest pain, palpitations and leg swelling.  Gastrointestinal: Negative.   Endocrine: Negative.   Genitourinary: Negative.   Musculoskeletal: Negative.   Skin: Negative.   Neurological: Negative.   Psychiatric/Behavioral: Negative.       Objective:    Physical Exam Vitals reviewed.  Constitutional:      Appearance: Normal appearance.  HENT:     Head: Normocephalic and atraumatic.     Right Ear: Tympanic membrane, ear canal and external ear normal.     Left Ear: Tympanic membrane, ear canal and external ear normal.     Nose: Nose normal.     Mouth/Throat:     Mouth: Mucous membranes are moist.  Eyes:     Extraocular Movements: Extraocular movements intact.     Conjunctiva/sclera: Conjunctivae normal.     Pupils: Pupils are equal, round, and reactive to light.  Cardiovascular:     Rate and Rhythm: Normal rate and regular rhythm.     Pulses: Normal pulses.     Heart sounds: Normal heart sounds.  Pulmonary:     Effort: Pulmonary effort is normal.     Breath sounds: Normal breath sounds.  Abdominal:     General: Abdomen is flat. Bowel sounds are normal.  Musculoskeletal:        General: Normal range of motion.     Cervical back: Normal range of motion and neck supple.  Skin:    General: Skin is warm and dry.     Capillary Refill: Capillary refill takes less than 2 seconds.  Neurological:     General: No focal deficit present.     Mental Status: He is alert and oriented to person, place, and time. Mental status is at baseline.     BP 118/74 (BP Location: Left Arm, Patient Position: Sitting, Cuff Size: Normal)   Pulse 65   Temp 98 F (36.7 C) (Temporal)   Ht 5\' 9"  (1.753 m)   Wt 218 lb (98.9 kg)   SpO2 96%    BMI 32.19 kg/m  Wt Readings from Last 3 Encounters:  04/05/20 218 lb (98.9 kg)  12/26/17 208 lb (94.3 kg)  06/27/17 217 lb 8 oz (98.7 kg)     Health Maintenance Due  Topic Date Due  . Hepatitis C Screening  Never done  . COVID-19 Vaccine (1) Never done  . TETANUS/TDAP  Never  done  . COLONOSCOPY  Never done  . PNA vac Low Risk Adult (1 of 2 - PCV13) Never done  . INFLUENZA VACCINE  Never done    There are no preventive care reminders to display for this patient.  No results found for: TSH Lab Results  Component Value Date   WBC 11.8 (H) 11/15/2016   HGB 11.6 (L) 11/15/2016   HCT 35.9 (L) 11/15/2016   MCV 89.3 11/15/2016   PLT 334 11/15/2016   Lab Results  Component Value Date   NA 139 11/15/2016   K 4.2 11/15/2016   CO2 20 (L) 11/15/2016   GLUCOSE 128 (H) 11/15/2016   BUN 40 (H) 11/15/2016   CREATININE 1.53 (H) 11/15/2016   BILITOT 0.4 11/15/2016   ALKPHOS 70 11/15/2016   AST 25 11/15/2016   ALT 25 11/15/2016   PROT 6.7 11/15/2016   ALBUMIN 3.0 (L) 11/15/2016   CALCIUM 8.7 (L) 11/15/2016   ANIONGAP 11 11/15/2016   No results found for: CHOL No results found for: HDL No results found for: LDLCALC No results found for: TRIG No results found for: CHOLHDL No results found for: CNGF9E    Assessment & Plan:   Problem List Items Addressed This Visit      Other   Routine general medical examination at a health care facility    DOT physical performed and forms completed    Follow-up: Return if symptoms worsen or fail to improve.    Brent Bulla, MD

## 2020-04-12 DIAGNOSIS — M109 Gout, unspecified: Secondary | ICD-10-CM | POA: Diagnosis not present

## 2020-04-12 DIAGNOSIS — D519 Vitamin B12 deficiency anemia, unspecified: Secondary | ICD-10-CM | POA: Diagnosis not present

## 2020-04-15 DIAGNOSIS — R339 Retention of urine, unspecified: Secondary | ICD-10-CM | POA: Diagnosis not present

## 2020-05-14 DIAGNOSIS — R339 Retention of urine, unspecified: Secondary | ICD-10-CM | POA: Diagnosis not present

## 2020-05-24 DIAGNOSIS — D519 Vitamin B12 deficiency anemia, unspecified: Secondary | ICD-10-CM | POA: Diagnosis not present

## 2020-05-24 DIAGNOSIS — E669 Obesity, unspecified: Secondary | ICD-10-CM | POA: Diagnosis not present

## 2020-05-24 DIAGNOSIS — Z0000 Encounter for general adult medical examination without abnormal findings: Secondary | ICD-10-CM | POA: Diagnosis not present

## 2020-05-24 DIAGNOSIS — Z9181 History of falling: Secondary | ICD-10-CM | POA: Diagnosis not present

## 2020-05-24 DIAGNOSIS — E785 Hyperlipidemia, unspecified: Secondary | ICD-10-CM | POA: Diagnosis not present

## 2020-05-24 DIAGNOSIS — Z139 Encounter for screening, unspecified: Secondary | ICD-10-CM | POA: Diagnosis not present

## 2020-05-24 DIAGNOSIS — Z1331 Encounter for screening for depression: Secondary | ICD-10-CM | POA: Diagnosis not present

## 2020-06-14 DIAGNOSIS — R339 Retention of urine, unspecified: Secondary | ICD-10-CM | POA: Diagnosis not present

## 2020-07-15 DIAGNOSIS — R339 Retention of urine, unspecified: Secondary | ICD-10-CM | POA: Diagnosis not present

## 2020-08-11 DIAGNOSIS — R339 Retention of urine, unspecified: Secondary | ICD-10-CM | POA: Diagnosis not present

## 2020-09-08 DIAGNOSIS — R339 Retention of urine, unspecified: Secondary | ICD-10-CM | POA: Diagnosis not present

## 2020-10-08 DIAGNOSIS — R339 Retention of urine, unspecified: Secondary | ICD-10-CM | POA: Diagnosis not present

## 2020-10-25 DIAGNOSIS — R972 Elevated prostate specific antigen [PSA]: Secondary | ICD-10-CM | POA: Diagnosis not present

## 2020-10-25 DIAGNOSIS — I6990 Unspecified sequelae of unspecified cerebrovascular disease: Secondary | ICD-10-CM | POA: Diagnosis not present

## 2020-10-25 DIAGNOSIS — I10 Essential (primary) hypertension: Secondary | ICD-10-CM | POA: Diagnosis not present

## 2020-10-25 DIAGNOSIS — E785 Hyperlipidemia, unspecified: Secondary | ICD-10-CM | POA: Diagnosis not present

## 2020-10-25 DIAGNOSIS — Z6833 Body mass index (BMI) 33.0-33.9, adult: Secondary | ICD-10-CM | POA: Diagnosis not present

## 2020-10-25 DIAGNOSIS — D519 Vitamin B12 deficiency anemia, unspecified: Secondary | ICD-10-CM | POA: Diagnosis not present

## 2020-11-01 DIAGNOSIS — R339 Retention of urine, unspecified: Secondary | ICD-10-CM | POA: Diagnosis not present

## 2020-11-02 DIAGNOSIS — Z01818 Encounter for other preprocedural examination: Secondary | ICD-10-CM | POA: Diagnosis not present

## 2020-11-02 DIAGNOSIS — H25813 Combined forms of age-related cataract, bilateral: Secondary | ICD-10-CM | POA: Diagnosis not present

## 2020-11-02 DIAGNOSIS — H25811 Combined forms of age-related cataract, right eye: Secondary | ICD-10-CM | POA: Diagnosis not present

## 2020-11-09 DIAGNOSIS — H25811 Combined forms of age-related cataract, right eye: Secondary | ICD-10-CM | POA: Diagnosis not present

## 2020-11-09 DIAGNOSIS — E785 Hyperlipidemia, unspecified: Secondary | ICD-10-CM | POA: Diagnosis not present

## 2020-11-09 DIAGNOSIS — Z7982 Long term (current) use of aspirin: Secondary | ICD-10-CM | POA: Diagnosis not present

## 2020-11-09 DIAGNOSIS — H259 Unspecified age-related cataract: Secondary | ICD-10-CM | POA: Diagnosis not present

## 2020-11-09 DIAGNOSIS — Z8673 Personal history of transient ischemic attack (TIA), and cerebral infarction without residual deficits: Secondary | ICD-10-CM | POA: Diagnosis not present

## 2020-11-09 DIAGNOSIS — E669 Obesity, unspecified: Secondary | ICD-10-CM | POA: Diagnosis not present

## 2020-11-09 DIAGNOSIS — I10 Essential (primary) hypertension: Secondary | ICD-10-CM | POA: Diagnosis not present

## 2020-11-18 DIAGNOSIS — M25512 Pain in left shoulder: Secondary | ICD-10-CM | POA: Diagnosis not present

## 2020-11-18 DIAGNOSIS — Z6834 Body mass index (BMI) 34.0-34.9, adult: Secondary | ICD-10-CM | POA: Diagnosis not present

## 2020-11-18 DIAGNOSIS — M159 Polyosteoarthritis, unspecified: Secondary | ICD-10-CM | POA: Diagnosis not present

## 2020-11-24 DIAGNOSIS — M25512 Pain in left shoulder: Secondary | ICD-10-CM | POA: Diagnosis not present

## 2020-11-24 DIAGNOSIS — Z6833 Body mass index (BMI) 33.0-33.9, adult: Secondary | ICD-10-CM | POA: Diagnosis not present

## 2020-11-24 DIAGNOSIS — M159 Polyosteoarthritis, unspecified: Secondary | ICD-10-CM | POA: Diagnosis not present

## 2020-11-30 DIAGNOSIS — E669 Obesity, unspecified: Secondary | ICD-10-CM | POA: Diagnosis not present

## 2020-11-30 DIAGNOSIS — H25812 Combined forms of age-related cataract, left eye: Secondary | ICD-10-CM | POA: Diagnosis not present

## 2020-11-30 DIAGNOSIS — E785 Hyperlipidemia, unspecified: Secondary | ICD-10-CM | POA: Diagnosis not present

## 2020-11-30 DIAGNOSIS — Z8673 Personal history of transient ischemic attack (TIA), and cerebral infarction without residual deficits: Secondary | ICD-10-CM | POA: Diagnosis not present

## 2020-11-30 DIAGNOSIS — H259 Unspecified age-related cataract: Secondary | ICD-10-CM | POA: Diagnosis not present

## 2020-11-30 DIAGNOSIS — Z7982 Long term (current) use of aspirin: Secondary | ICD-10-CM | POA: Diagnosis not present

## 2020-11-30 DIAGNOSIS — I10 Essential (primary) hypertension: Secondary | ICD-10-CM | POA: Diagnosis not present

## 2020-12-01 DIAGNOSIS — R339 Retention of urine, unspecified: Secondary | ICD-10-CM | POA: Diagnosis not present

## 2020-12-13 DIAGNOSIS — R972 Elevated prostate specific antigen [PSA]: Secondary | ICD-10-CM | POA: Diagnosis not present

## 2020-12-13 DIAGNOSIS — N401 Enlarged prostate with lower urinary tract symptoms: Secondary | ICD-10-CM | POA: Diagnosis not present

## 2020-12-13 DIAGNOSIS — N319 Neuromuscular dysfunction of bladder, unspecified: Secondary | ICD-10-CM | POA: Diagnosis not present

## 2021-01-04 DIAGNOSIS — R339 Retention of urine, unspecified: Secondary | ICD-10-CM | POA: Diagnosis not present

## 2021-01-31 DIAGNOSIS — N401 Enlarged prostate with lower urinary tract symptoms: Secondary | ICD-10-CM | POA: Diagnosis not present

## 2021-01-31 DIAGNOSIS — N319 Neuromuscular dysfunction of bladder, unspecified: Secondary | ICD-10-CM | POA: Diagnosis not present

## 2021-01-31 DIAGNOSIS — R972 Elevated prostate specific antigen [PSA]: Secondary | ICD-10-CM | POA: Diagnosis not present

## 2021-02-01 DIAGNOSIS — R339 Retention of urine, unspecified: Secondary | ICD-10-CM | POA: Diagnosis not present

## 2021-02-04 DIAGNOSIS — R972 Elevated prostate specific antigen [PSA]: Secondary | ICD-10-CM | POA: Diagnosis not present

## 2021-02-04 DIAGNOSIS — I10 Essential (primary) hypertension: Secondary | ICD-10-CM | POA: Diagnosis not present

## 2021-02-04 DIAGNOSIS — G8191 Hemiplegia, unspecified affecting right dominant side: Secondary | ICD-10-CM | POA: Diagnosis not present

## 2021-02-04 DIAGNOSIS — Z6834 Body mass index (BMI) 34.0-34.9, adult: Secondary | ICD-10-CM | POA: Diagnosis not present

## 2021-02-04 DIAGNOSIS — D519 Vitamin B12 deficiency anemia, unspecified: Secondary | ICD-10-CM | POA: Diagnosis not present

## 2021-02-04 DIAGNOSIS — E785 Hyperlipidemia, unspecified: Secondary | ICD-10-CM | POA: Diagnosis not present

## 2021-02-04 DIAGNOSIS — E669 Obesity, unspecified: Secondary | ICD-10-CM | POA: Diagnosis not present

## 2021-02-21 DIAGNOSIS — R339 Retention of urine, unspecified: Secondary | ICD-10-CM | POA: Diagnosis not present

## 2021-03-23 DIAGNOSIS — R339 Retention of urine, unspecified: Secondary | ICD-10-CM | POA: Diagnosis not present

## 2021-03-28 ENCOUNTER — Encounter: Payer: Self-pay | Admitting: Legal Medicine

## 2021-03-28 ENCOUNTER — Other Ambulatory Visit: Payer: Self-pay

## 2021-03-28 ENCOUNTER — Ambulatory Visit: Payer: Self-pay | Admitting: Legal Medicine

## 2021-03-28 VITALS — BP 164/100 | HR 80 | Temp 97.6°F | Resp 16 | Ht 69.0 in | Wt 219.0 lb

## 2021-03-28 DIAGNOSIS — Z024 Encounter for examination for driving license: Secondary | ICD-10-CM

## 2021-03-28 LAB — POCT URINALYSIS DIP (CLINITEK)
Bilirubin, UA: NEGATIVE
Blood, UA: NEGATIVE
Glucose, UA: NEGATIVE mg/dL
Ketones, POC UA: NEGATIVE mg/dL
Leukocytes, UA: NEGATIVE
Nitrite, UA: NEGATIVE
Spec Grav, UA: 1.025 (ref 1.010–1.025)
Urobilinogen, UA: 0.2 E.U./dL
pH, UA: 6 (ref 5.0–8.0)

## 2021-03-28 NOTE — Progress Notes (Signed)
Subjective:  Patient ID: [name], male    DOB: 1951-01-17  Age: 70 y.o. MRN: [id number]  Chief Complaint  Patient presents with   DOT physical    HPI dot physical   Current Outpatient Medications on File Prior to Visit  Medication Sig Dispense Refill   aspirin EC 325 MG tablet Take 325 mg by mouth daily.     cyanocobalamin (,VITAMIN B-12,) 1000 MCG/ML injection Inject 1,000 mcg into the muscle every 30 (thirty) days.     lisinopril (PRINIVIL,ZESTRIL) 20 MG tablet Take 20 mg by mouth 2 (two) times daily.     Multiple Vitamins-Minerals (MULTIVITAMIN WITH MINERALS) tablet Take by mouth.     simvastatin (ZOCOR) 80 MG tablet Take 80 mg by mouth at bedtime.     No current facility-administered medications on file prior to visit.   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 No pertinent surgical history.  Family History  Problem Relation Age of Onset   Hypertension Mother    Stroke Mother    Coronary artery disease Mother    Social History   Socioeconomic History   Marital status: Married    Spouse name: Not on file   Number of children: 1   Years of education: 58   Highest education level: Not on file  Occupational History   Occupation: [employer] doctor  Tobacco Use   Smoking status: Former    Types: Cigarettes    Quit date: 07/03/2000    Years since quitting: 20.7   Smokeless tobacco: Never  Vaping Use   Vaping Use: Never used  Substance and Sexual Activity   Alcohol use: No   Drug use: No   Sexual activity: Not on file  Other Topics Concern   Not on file  Social History Narrative   Lives with girlfriend   Caffeine use: 2 cup coffee per day   Right handed   Social Determinants of Health    Financial Resource Strain: Not on file  Food Insecurity: Not on file  Transportation Needs: Not on file  Physical Activity: Not on file  Stress: Not on file  Social Connections: Not on file    Review of Systems  Constitutional:  Negative for chills, fatigue and fever.  HENT:  Negative for congestion, ear pain and sore throat.   Respiratory:  Negative for cough and shortness of breath.   Cardiovascular:  Negative for chest pain.  Gastrointestinal:  Negative for abdominal pain, constipation, diarrhea, nausea and vomiting.  Endocrine: Negative for polydipsia, polyphagia and polyuria.  Genitourinary:  Negative for dysuria and frequency.  Musculoskeletal:  Negative for arthralgias and myalgias.  Neurological:  Negative for dizziness and headaches.  Psychiatric/Behavioral:  Negative for dysphoric mood.        No dysphoria    Objective:  BP (!) 164/100   Pulse 80   Temp 97.6 F (36.4 C)   Resp 16   Ht 5\' 9"  (1.753 m)   Wt 219 lb (99.3 kg)   BMI 32.34 kg/m   BP/Weight 03/28/2021 04/05/2020 12/26/2017  Systolic BP 164 118 130  Diastolic BP 100 74 78  Wt. (Lbs) 219 218 208  BMI 32.34 32.19 31.63  Physical Exam Constitutional:      General: He is not in acute distress.    Appearance: Normal appearance. He is obese.  HENT:     Right Ear: Tympanic membrane normal.     Left Ear: Tympanic membrane normal.     Mouth/Throat:     Mouth: Mucous membranes are moist.     Pharynx: Oropharynx is clear.  Eyes:     Extraocular Movements: Extraocular movements intact.     Conjunctiva/sclera: Conjunctivae normal.     Pupils: Pupils are equal, round, and reactive to light.  Cardiovascular:     Rate and Rhythm: Normal rate and regular rhythm.     Pulses: Normal pulses.     Heart sounds: Normal heart sounds. No murmur heard.   No gallop.  Pulmonary:     Effort: Pulmonary effort is normal. No respiratory distress.     Breath sounds: No wheezing.  Abdominal:     General: Abdomen is  flat. Bowel sounds are normal. There is no distension.     Palpations: Abdomen is soft.     Tenderness: There is no abdominal tenderness.  Musculoskeletal:        General: Normal range of motion.     Cervical back: Normal range of motion.     Right lower leg: No edema.     Left lower leg: No edema.  Skin:    General: Skin is warm.     Capillary Refill: Capillary refill takes less than 2 seconds.  Neurological:     General: No focal deficit present.     Mental Status: He is alert. Mental status is at baseline.        Lab Results  Component Value Date   WBC 11.8 (H) 11/15/2016   HGB 11.6 (L) 11/15/2016   HCT 35.9 (L) 11/15/2016   PLT 334 11/15/2016   GLUCOSE 128 (H) 11/15/2016   ALT 25 11/15/2016   AST 25 11/15/2016   NA 139 11/15/2016   K 4.2 11/15/2016   CL 108 11/15/2016   CREATININE 1.53 (H) 11/15/2016   BUN 40 (H) 11/15/2016   CO2 20 (L) 11/15/2016      Assessment & Plan:   Problem List Items Addressed This Visit   None Visit Diagnoses     Encounter for Department of Transportation (DOT) examination for driving license renewal    -  Primary   Relevant Orders   POCT URINALYSIS DIP (CLIN[facility]K) Paperwork completed for one year     .    Orders Placed This Encounter  Procedures   POCT URINALYSIS DIP (CLINITEK)     Follow-up: No follow-ups on file.  An After Visit Summary was printed and given to the patient.  Brent Bulla, MD [facility] [phone]

## 2021-04-20 DIAGNOSIS — R339 Retention of urine, unspecified: Secondary | ICD-10-CM | POA: Diagnosis not present

## 2021-05-06 DIAGNOSIS — R972 Elevated prostate specific antigen [PSA]: Secondary | ICD-10-CM | POA: Diagnosis not present

## 2021-05-06 DIAGNOSIS — N401 Enlarged prostate with lower urinary tract symptoms: Secondary | ICD-10-CM | POA: Diagnosis not present

## 2021-05-06 DIAGNOSIS — N319 Neuromuscular dysfunction of bladder, unspecified: Secondary | ICD-10-CM | POA: Diagnosis not present

## 2021-05-09 ENCOUNTER — Other Ambulatory Visit: Payer: Self-pay | Admitting: Urology

## 2021-05-09 DIAGNOSIS — N319 Neuromuscular dysfunction of bladder, unspecified: Secondary | ICD-10-CM

## 2021-05-09 DIAGNOSIS — R972 Elevated prostate specific antigen [PSA]: Secondary | ICD-10-CM

## 2021-05-13 DIAGNOSIS — I10 Essential (primary) hypertension: Secondary | ICD-10-CM | POA: Diagnosis not present

## 2021-05-13 DIAGNOSIS — E785 Hyperlipidemia, unspecified: Secondary | ICD-10-CM | POA: Diagnosis not present

## 2021-05-13 DIAGNOSIS — D519 Vitamin B12 deficiency anemia, unspecified: Secondary | ICD-10-CM | POA: Diagnosis not present

## 2021-05-13 DIAGNOSIS — R972 Elevated prostate specific antigen [PSA]: Secondary | ICD-10-CM | POA: Diagnosis not present

## 2021-05-16 ENCOUNTER — Ambulatory Visit
Admission: RE | Admit: 2021-05-16 | Discharge: 2021-05-16 | Disposition: A | Discharge status: Home / Self Care | Payer: PPO | Source: Ambulatory Visit | Attending: Urology | Admitting: Urology

## 2021-05-16 DIAGNOSIS — N319 Neuromuscular dysfunction of bladder, unspecified: Secondary | ICD-10-CM

## 2021-05-16 DIAGNOSIS — N400 Enlarged prostate without lower urinary tract symptoms: Secondary | ICD-10-CM | POA: Diagnosis not present

## 2021-05-17 DIAGNOSIS — R339 Retention of urine, unspecified: Secondary | ICD-10-CM | POA: Diagnosis not present

## 2021-05-30 DIAGNOSIS — E669 Obesity, unspecified: Secondary | ICD-10-CM | POA: Diagnosis not present

## 2021-05-30 DIAGNOSIS — Z1331 Encounter for screening for depression: Secondary | ICD-10-CM | POA: Diagnosis not present

## 2021-05-30 DIAGNOSIS — Z6834 Body mass index (BMI) 34.0-34.9, adult: Secondary | ICD-10-CM | POA: Diagnosis not present

## 2021-05-30 DIAGNOSIS — E785 Hyperlipidemia, unspecified: Secondary | ICD-10-CM | POA: Diagnosis not present

## 2021-05-30 DIAGNOSIS — Z0000 Encounter for general adult medical examination without abnormal findings: Secondary | ICD-10-CM | POA: Diagnosis not present

## 2021-05-30 DIAGNOSIS — Z9181 History of falling: Secondary | ICD-10-CM | POA: Diagnosis not present

## 2021-06-03 ENCOUNTER — Ambulatory Visit
Admission: RE | Admit: 2021-06-03 | Discharge: 2021-06-03 | Disposition: A | Discharge status: Home / Self Care | Payer: PPO | Source: Ambulatory Visit | Attending: Urology | Admitting: Urology

## 2021-06-03 DIAGNOSIS — R972 Elevated prostate specific antigen [PSA]: Secondary | ICD-10-CM

## 2021-06-03 DIAGNOSIS — N400 Enlarged prostate without lower urinary tract symptoms: Secondary | ICD-10-CM | POA: Diagnosis not present

## 2021-06-03 MED ORDER — GADOBENATE DIMEGLUMINE 529 MG/ML IV SOLN
20.0000 mL | Freq: Once | INTRAVENOUS | Status: AC | PRN
  Administered 2021-06-03: 20 mL via INTRAVENOUS

## 2021-06-08 DIAGNOSIS — R339 Retention of urine, unspecified: Secondary | ICD-10-CM | POA: Diagnosis not present

## 2021-06-13 DIAGNOSIS — D519 Vitamin B12 deficiency anemia, unspecified: Secondary | ICD-10-CM | POA: Diagnosis not present

## 2021-07-08 DIAGNOSIS — R339 Retention of urine, unspecified: Secondary | ICD-10-CM | POA: Diagnosis not present

## 2021-07-18 DIAGNOSIS — D519 Vitamin B12 deficiency anemia, unspecified: Secondary | ICD-10-CM | POA: Diagnosis not present

## 2021-08-05 DIAGNOSIS — R339 Retention of urine, unspecified: Secondary | ICD-10-CM | POA: Diagnosis not present

## 2021-08-13 DIAGNOSIS — I10 Essential (primary) hypertension: Secondary | ICD-10-CM | POA: Diagnosis not present

## 2021-08-13 DIAGNOSIS — Z6834 Body mass index (BMI) 34.0-34.9, adult: Secondary | ICD-10-CM | POA: Diagnosis not present

## 2021-08-13 DIAGNOSIS — G8191 Hemiplegia, unspecified affecting right dominant side: Secondary | ICD-10-CM | POA: Diagnosis not present

## 2021-08-13 DIAGNOSIS — D519 Vitamin B12 deficiency anemia, unspecified: Secondary | ICD-10-CM | POA: Diagnosis not present

## 2021-08-13 DIAGNOSIS — R972 Elevated prostate specific antigen [PSA]: Secondary | ICD-10-CM | POA: Diagnosis not present

## 2021-08-13 DIAGNOSIS — E785 Hyperlipidemia, unspecified: Secondary | ICD-10-CM | POA: Diagnosis not present

## 2021-08-22 DIAGNOSIS — R739 Hyperglycemia, unspecified: Secondary | ICD-10-CM | POA: Diagnosis not present

## 2021-08-22 DIAGNOSIS — D519 Vitamin B12 deficiency anemia, unspecified: Secondary | ICD-10-CM | POA: Diagnosis not present

## 2021-09-02 DIAGNOSIS — R339 Retention of urine, unspecified: Secondary | ICD-10-CM | POA: Diagnosis not present

## 2021-09-19 DIAGNOSIS — N319 Neuromuscular dysfunction of bladder, unspecified: Secondary | ICD-10-CM | POA: Diagnosis not present

## 2021-09-19 DIAGNOSIS — R972 Elevated prostate specific antigen [PSA]: Secondary | ICD-10-CM | POA: Diagnosis not present

## 2021-09-19 DIAGNOSIS — N401 Enlarged prostate with lower urinary tract symptoms: Secondary | ICD-10-CM | POA: Diagnosis not present

## 2021-09-26 DIAGNOSIS — D519 Vitamin B12 deficiency anemia, unspecified: Secondary | ICD-10-CM | POA: Diagnosis not present

## 2021-09-29 DIAGNOSIS — R339 Retention of urine, unspecified: Secondary | ICD-10-CM | POA: Diagnosis not present

## 2021-10-27 DIAGNOSIS — R339 Retention of urine, unspecified: Secondary | ICD-10-CM | POA: Diagnosis not present

## 2021-10-28 DIAGNOSIS — D519 Vitamin B12 deficiency anemia, unspecified: Secondary | ICD-10-CM | POA: Diagnosis not present

## 2021-11-25 DIAGNOSIS — R339 Retention of urine, unspecified: Secondary | ICD-10-CM | POA: Diagnosis not present

## 2021-12-02 DIAGNOSIS — D519 Vitamin B12 deficiency anemia, unspecified: Secondary | ICD-10-CM | POA: Diagnosis not present

## 2021-12-27 DIAGNOSIS — R339 Retention of urine, unspecified: Secondary | ICD-10-CM | POA: Diagnosis not present

## 2022-01-02 DIAGNOSIS — D519 Vitamin B12 deficiency anemia, unspecified: Secondary | ICD-10-CM | POA: Diagnosis not present

## 2022-01-24 DIAGNOSIS — R339 Retention of urine, unspecified: Secondary | ICD-10-CM | POA: Diagnosis not present

## 2022-02-23 DIAGNOSIS — R339 Retention of urine, unspecified: Secondary | ICD-10-CM | POA: Diagnosis not present

## 2022-03-17 DIAGNOSIS — E785 Hyperlipidemia, unspecified: Secondary | ICD-10-CM | POA: Diagnosis not present

## 2022-03-17 DIAGNOSIS — G8191 Hemiplegia, unspecified affecting right dominant side: Secondary | ICD-10-CM | POA: Diagnosis not present

## 2022-03-17 DIAGNOSIS — R972 Elevated prostate specific antigen [PSA]: Secondary | ICD-10-CM | POA: Diagnosis not present

## 2022-03-17 DIAGNOSIS — D519 Vitamin B12 deficiency anemia, unspecified: Secondary | ICD-10-CM | POA: Diagnosis not present

## 2022-03-17 DIAGNOSIS — I10 Essential (primary) hypertension: Secondary | ICD-10-CM | POA: Diagnosis not present

## 2022-03-20 DIAGNOSIS — R339 Retention of urine, unspecified: Secondary | ICD-10-CM | POA: Diagnosis not present

## 2022-03-27 DIAGNOSIS — N319 Neuromuscular dysfunction of bladder, unspecified: Secondary | ICD-10-CM | POA: Diagnosis not present

## 2022-03-27 DIAGNOSIS — N401 Enlarged prostate with lower urinary tract symptoms: Secondary | ICD-10-CM | POA: Diagnosis not present

## 2022-03-27 DIAGNOSIS — R972 Elevated prostate specific antigen [PSA]: Secondary | ICD-10-CM | POA: Diagnosis not present

## 2022-04-17 DIAGNOSIS — R339 Retention of urine, unspecified: Secondary | ICD-10-CM | POA: Diagnosis not present

## 2022-04-18 DIAGNOSIS — D519 Vitamin B12 deficiency anemia, unspecified: Secondary | ICD-10-CM | POA: Diagnosis not present

## 2022-05-15 DIAGNOSIS — R339 Retention of urine, unspecified: Secondary | ICD-10-CM | POA: Diagnosis not present

## 2022-05-18 DIAGNOSIS — D519 Vitamin B12 deficiency anemia, unspecified: Secondary | ICD-10-CM | POA: Diagnosis not present

## 2022-06-12 DIAGNOSIS — R339 Retention of urine, unspecified: Secondary | ICD-10-CM | POA: Diagnosis not present

## 2022-06-19 DIAGNOSIS — D519 Vitamin B12 deficiency anemia, unspecified: Secondary | ICD-10-CM | POA: Diagnosis not present

## 2022-06-19 DIAGNOSIS — I679 Cerebrovascular disease, unspecified: Secondary | ICD-10-CM | POA: Diagnosis not present

## 2022-06-19 DIAGNOSIS — G8191 Hemiplegia, unspecified affecting right dominant side: Secondary | ICD-10-CM | POA: Diagnosis not present

## 2022-06-19 DIAGNOSIS — R972 Elevated prostate specific antigen [PSA]: Secondary | ICD-10-CM | POA: Diagnosis not present

## 2022-06-19 DIAGNOSIS — E785 Hyperlipidemia, unspecified: Secondary | ICD-10-CM | POA: Diagnosis not present

## 2022-06-20 DIAGNOSIS — R7301 Impaired fasting glucose: Secondary | ICD-10-CM | POA: Diagnosis not present

## 2022-07-05 DIAGNOSIS — E119 Type 2 diabetes mellitus without complications: Secondary | ICD-10-CM | POA: Diagnosis not present

## 2022-07-14 DIAGNOSIS — R339 Retention of urine, unspecified: Secondary | ICD-10-CM | POA: Diagnosis not present

## 2022-07-20 DIAGNOSIS — D519 Vitamin B12 deficiency anemia, unspecified: Secondary | ICD-10-CM | POA: Diagnosis not present

## 2022-08-15 DIAGNOSIS — R339 Retention of urine, unspecified: Secondary | ICD-10-CM | POA: Diagnosis not present

## 2022-08-22 DIAGNOSIS — D519 Vitamin B12 deficiency anemia, unspecified: Secondary | ICD-10-CM | POA: Diagnosis not present

## 2022-09-12 DIAGNOSIS — R339 Retention of urine, unspecified: Secondary | ICD-10-CM | POA: Diagnosis not present

## 2022-09-20 DIAGNOSIS — E1169 Type 2 diabetes mellitus with other specified complication: Secondary | ICD-10-CM | POA: Diagnosis not present

## 2022-09-20 DIAGNOSIS — D519 Vitamin B12 deficiency anemia, unspecified: Secondary | ICD-10-CM | POA: Diagnosis not present

## 2022-09-20 DIAGNOSIS — E785 Hyperlipidemia, unspecified: Secondary | ICD-10-CM | POA: Diagnosis not present

## 2022-09-20 DIAGNOSIS — I679 Cerebrovascular disease, unspecified: Secondary | ICD-10-CM | POA: Diagnosis not present

## 2022-09-21 DIAGNOSIS — D519 Vitamin B12 deficiency anemia, unspecified: Secondary | ICD-10-CM | POA: Diagnosis not present

## 2022-09-21 DIAGNOSIS — E785 Hyperlipidemia, unspecified: Secondary | ICD-10-CM | POA: Diagnosis not present

## 2022-09-21 DIAGNOSIS — R972 Elevated prostate specific antigen [PSA]: Secondary | ICD-10-CM | POA: Diagnosis not present

## 2022-09-21 DIAGNOSIS — E1169 Type 2 diabetes mellitus with other specified complication: Secondary | ICD-10-CM | POA: Diagnosis not present

## 2022-09-25 DIAGNOSIS — N401 Enlarged prostate with lower urinary tract symptoms: Secondary | ICD-10-CM | POA: Diagnosis not present

## 2022-09-25 DIAGNOSIS — N319 Neuromuscular dysfunction of bladder, unspecified: Secondary | ICD-10-CM | POA: Diagnosis not present

## 2022-09-25 DIAGNOSIS — R972 Elevated prostate specific antigen [PSA]: Secondary | ICD-10-CM | POA: Diagnosis not present

## 2022-10-05 DIAGNOSIS — N3289 Other specified disorders of bladder: Secondary | ICD-10-CM | POA: Diagnosis not present

## 2022-10-05 DIAGNOSIS — N319 Neuromuscular dysfunction of bladder, unspecified: Secondary | ICD-10-CM | POA: Diagnosis not present

## 2022-10-05 DIAGNOSIS — N400 Enlarged prostate without lower urinary tract symptoms: Secondary | ICD-10-CM | POA: Diagnosis not present

## 2022-10-11 DIAGNOSIS — R339 Retention of urine, unspecified: Secondary | ICD-10-CM | POA: Diagnosis not present

## 2022-10-31 DIAGNOSIS — D519 Vitamin B12 deficiency anemia, unspecified: Secondary | ICD-10-CM | POA: Diagnosis not present

## 2022-11-10 DIAGNOSIS — R339 Retention of urine, unspecified: Secondary | ICD-10-CM | POA: Diagnosis not present

## 2022-11-30 DIAGNOSIS — D519 Vitamin B12 deficiency anemia, unspecified: Secondary | ICD-10-CM | POA: Diagnosis not present

## 2022-12-11 DIAGNOSIS — R339 Retention of urine, unspecified: Secondary | ICD-10-CM | POA: Diagnosis not present

## 2022-12-26 DIAGNOSIS — R972 Elevated prostate specific antigen [PSA]: Secondary | ICD-10-CM | POA: Diagnosis not present

## 2022-12-26 DIAGNOSIS — G373 Acute transverse myelitis in demyelinating disease of central nervous system: Secondary | ICD-10-CM | POA: Diagnosis not present

## 2022-12-26 DIAGNOSIS — D519 Vitamin B12 deficiency anemia, unspecified: Secondary | ICD-10-CM | POA: Diagnosis not present

## 2022-12-26 DIAGNOSIS — E1169 Type 2 diabetes mellitus with other specified complication: Secondary | ICD-10-CM | POA: Diagnosis not present

## 2022-12-26 DIAGNOSIS — G729 Myopathy, unspecified: Secondary | ICD-10-CM | POA: Diagnosis not present

## 2022-12-26 DIAGNOSIS — E785 Hyperlipidemia, unspecified: Secondary | ICD-10-CM | POA: Diagnosis not present

## 2022-12-26 DIAGNOSIS — G8191 Hemiplegia, unspecified affecting right dominant side: Secondary | ICD-10-CM | POA: Diagnosis not present

## 2022-12-26 DIAGNOSIS — I10 Essential (primary) hypertension: Secondary | ICD-10-CM | POA: Diagnosis not present

## 2022-12-26 DIAGNOSIS — I679 Cerebrovascular disease, unspecified: Secondary | ICD-10-CM | POA: Diagnosis not present

## 2023-01-03 DIAGNOSIS — D519 Vitamin B12 deficiency anemia, unspecified: Secondary | ICD-10-CM | POA: Diagnosis not present

## 2023-01-11 DIAGNOSIS — R339 Retention of urine, unspecified: Secondary | ICD-10-CM | POA: Diagnosis not present

## 2023-02-07 DIAGNOSIS — D519 Vitamin B12 deficiency anemia, unspecified: Secondary | ICD-10-CM | POA: Diagnosis not present

## 2023-02-12 DIAGNOSIS — R339 Retention of urine, unspecified: Secondary | ICD-10-CM | POA: Diagnosis not present

## 2023-03-14 DIAGNOSIS — D519 Vitamin B12 deficiency anemia, unspecified: Secondary | ICD-10-CM | POA: Diagnosis not present

## 2023-03-14 DIAGNOSIS — R339 Retention of urine, unspecified: Secondary | ICD-10-CM | POA: Diagnosis not present

## 2023-03-20 DIAGNOSIS — E785 Hyperlipidemia, unspecified: Secondary | ICD-10-CM | POA: Diagnosis not present

## 2023-03-20 DIAGNOSIS — F17200 Nicotine dependence, unspecified, uncomplicated: Secondary | ICD-10-CM | POA: Diagnosis not present

## 2023-03-20 DIAGNOSIS — E669 Obesity, unspecified: Secondary | ICD-10-CM | POA: Diagnosis not present

## 2023-03-20 DIAGNOSIS — I10 Essential (primary) hypertension: Secondary | ICD-10-CM | POA: Diagnosis not present

## 2023-03-20 DIAGNOSIS — Z8673 Personal history of transient ischemic attack (TIA), and cerebral infarction without residual deficits: Secondary | ICD-10-CM | POA: Diagnosis not present

## 2023-03-20 DIAGNOSIS — G8929 Other chronic pain: Secondary | ICD-10-CM | POA: Diagnosis not present

## 2023-03-20 DIAGNOSIS — Z87891 Personal history of nicotine dependence: Secondary | ICD-10-CM | POA: Diagnosis not present

## 2023-03-29 DIAGNOSIS — N319 Neuromuscular dysfunction of bladder, unspecified: Secondary | ICD-10-CM | POA: Diagnosis not present

## 2023-03-29 DIAGNOSIS — N401 Enlarged prostate with lower urinary tract symptoms: Secondary | ICD-10-CM | POA: Diagnosis not present

## 2023-03-29 DIAGNOSIS — R972 Elevated prostate specific antigen [PSA]: Secondary | ICD-10-CM | POA: Diagnosis not present

## 2023-04-02 DIAGNOSIS — I679 Cerebrovascular disease, unspecified: Secondary | ICD-10-CM | POA: Diagnosis not present

## 2023-04-02 DIAGNOSIS — G373 Acute transverse myelitis in demyelinating disease of central nervous system: Secondary | ICD-10-CM | POA: Diagnosis not present

## 2023-04-02 DIAGNOSIS — D519 Vitamin B12 deficiency anemia, unspecified: Secondary | ICD-10-CM | POA: Diagnosis not present

## 2023-04-02 DIAGNOSIS — G729 Myopathy, unspecified: Secondary | ICD-10-CM | POA: Diagnosis not present

## 2023-04-02 DIAGNOSIS — I10 Essential (primary) hypertension: Secondary | ICD-10-CM | POA: Diagnosis not present

## 2023-04-02 DIAGNOSIS — G8191 Hemiplegia, unspecified affecting right dominant side: Secondary | ICD-10-CM | POA: Diagnosis not present

## 2023-04-02 DIAGNOSIS — S14129A Central cord syndrome at unspecified level of cervical spinal cord, initial encounter: Secondary | ICD-10-CM | POA: Diagnosis not present

## 2023-04-02 DIAGNOSIS — E291 Testicular hypofunction: Secondary | ICD-10-CM | POA: Diagnosis not present

## 2023-04-02 DIAGNOSIS — E1169 Type 2 diabetes mellitus with other specified complication: Secondary | ICD-10-CM | POA: Diagnosis not present

## 2023-04-02 DIAGNOSIS — R972 Elevated prostate specific antigen [PSA]: Secondary | ICD-10-CM | POA: Diagnosis not present

## 2023-04-02 DIAGNOSIS — E785 Hyperlipidemia, unspecified: Secondary | ICD-10-CM | POA: Diagnosis not present

## 2023-04-12 DIAGNOSIS — R339 Retention of urine, unspecified: Secondary | ICD-10-CM | POA: Diagnosis not present

## 2023-04-18 DIAGNOSIS — D519 Vitamin B12 deficiency anemia, unspecified: Secondary | ICD-10-CM | POA: Diagnosis not present

## 2023-05-11 DIAGNOSIS — R339 Retention of urine, unspecified: Secondary | ICD-10-CM | POA: Diagnosis not present

## 2023-05-23 DIAGNOSIS — D519 Vitamin B12 deficiency anemia, unspecified: Secondary | ICD-10-CM | POA: Diagnosis not present

## 2023-06-08 DIAGNOSIS — R339 Retention of urine, unspecified: Secondary | ICD-10-CM | POA: Diagnosis not present

## 2023-06-28 DIAGNOSIS — D519 Vitamin B12 deficiency anemia, unspecified: Secondary | ICD-10-CM | POA: Diagnosis not present

## 2023-07-05 DIAGNOSIS — G720 Drug-induced myopathy: Secondary | ICD-10-CM | POA: Diagnosis not present

## 2023-07-05 DIAGNOSIS — R972 Elevated prostate specific antigen [PSA]: Secondary | ICD-10-CM | POA: Diagnosis not present

## 2023-07-05 DIAGNOSIS — E785 Hyperlipidemia, unspecified: Secondary | ICD-10-CM | POA: Diagnosis not present

## 2023-07-05 DIAGNOSIS — G373 Acute transverse myelitis in demyelinating disease of central nervous system: Secondary | ICD-10-CM | POA: Diagnosis not present

## 2023-07-05 DIAGNOSIS — D519 Vitamin B12 deficiency anemia, unspecified: Secondary | ICD-10-CM | POA: Diagnosis not present

## 2023-07-05 DIAGNOSIS — E1169 Type 2 diabetes mellitus with other specified complication: Secondary | ICD-10-CM | POA: Diagnosis not present

## 2023-07-05 DIAGNOSIS — I679 Cerebrovascular disease, unspecified: Secondary | ICD-10-CM | POA: Diagnosis not present

## 2023-07-05 DIAGNOSIS — G8191 Hemiplegia, unspecified affecting right dominant side: Secondary | ICD-10-CM | POA: Diagnosis not present

## 2023-07-05 DIAGNOSIS — I10 Essential (primary) hypertension: Secondary | ICD-10-CM | POA: Diagnosis not present

## 2023-07-05 DIAGNOSIS — T466X5A Adverse effect of antihyperlipidemic and antiarteriosclerotic drugs, initial encounter: Secondary | ICD-10-CM | POA: Diagnosis not present

## 2023-07-11 DIAGNOSIS — R339 Retention of urine, unspecified: Secondary | ICD-10-CM | POA: Diagnosis not present

## 2023-08-01 DIAGNOSIS — D519 Vitamin B12 deficiency anemia, unspecified: Secondary | ICD-10-CM | POA: Diagnosis not present

## 2023-08-09 DIAGNOSIS — R339 Retention of urine, unspecified: Secondary | ICD-10-CM | POA: Diagnosis not present

## 2023-09-05 DIAGNOSIS — R339 Retention of urine, unspecified: Secondary | ICD-10-CM | POA: Diagnosis not present

## 2023-09-05 DIAGNOSIS — D519 Vitamin B12 deficiency anemia, unspecified: Secondary | ICD-10-CM | POA: Diagnosis not present

## 2023-09-25 DIAGNOSIS — N401 Enlarged prostate with lower urinary tract symptoms: Secondary | ICD-10-CM | POA: Diagnosis not present

## 2023-09-25 DIAGNOSIS — N319 Neuromuscular dysfunction of bladder, unspecified: Secondary | ICD-10-CM | POA: Diagnosis not present

## 2023-09-25 DIAGNOSIS — R972 Elevated prostate specific antigen [PSA]: Secondary | ICD-10-CM | POA: Diagnosis not present

## 2023-09-27 ENCOUNTER — Other Ambulatory Visit: Payer: Self-pay | Admitting: Nurse Practitioner

## 2023-09-27 DIAGNOSIS — R972 Elevated prostate specific antigen [PSA]: Secondary | ICD-10-CM

## 2023-10-04 DIAGNOSIS — R339 Retention of urine, unspecified: Secondary | ICD-10-CM | POA: Diagnosis not present

## 2023-10-10 DIAGNOSIS — D519 Vitamin B12 deficiency anemia, unspecified: Secondary | ICD-10-CM | POA: Diagnosis not present

## 2023-10-12 DIAGNOSIS — I10 Essential (primary) hypertension: Secondary | ICD-10-CM | POA: Diagnosis not present

## 2023-10-12 DIAGNOSIS — E785 Hyperlipidemia, unspecified: Secondary | ICD-10-CM | POA: Diagnosis not present

## 2023-10-12 DIAGNOSIS — I679 Cerebrovascular disease, unspecified: Secondary | ICD-10-CM | POA: Diagnosis not present

## 2023-10-12 DIAGNOSIS — Z6829 Body mass index (BMI) 29.0-29.9, adult: Secondary | ICD-10-CM | POA: Diagnosis not present

## 2023-10-12 DIAGNOSIS — G8191 Hemiplegia, unspecified affecting right dominant side: Secondary | ICD-10-CM | POA: Diagnosis not present

## 2023-10-12 DIAGNOSIS — E1169 Type 2 diabetes mellitus with other specified complication: Secondary | ICD-10-CM | POA: Diagnosis not present

## 2023-10-12 DIAGNOSIS — D519 Vitamin B12 deficiency anemia, unspecified: Secondary | ICD-10-CM | POA: Diagnosis not present

## 2023-10-12 DIAGNOSIS — R972 Elevated prostate specific antigen [PSA]: Secondary | ICD-10-CM | POA: Diagnosis not present

## 2023-10-22 ENCOUNTER — Ambulatory Visit
Admission: RE | Admit: 2023-10-22 | Discharge: 2023-10-22 | Disposition: A | Discharge status: Home / Self Care | Source: Ambulatory Visit | Attending: Nurse Practitioner | Admitting: Nurse Practitioner

## 2023-10-22 DIAGNOSIS — R972 Elevated prostate specific antigen [PSA]: Secondary | ICD-10-CM

## 2023-10-22 MED ORDER — GADOPICLENOL 0.5 MMOL/ML IV SOLN
10.0000 mL | Freq: Once | INTRAVENOUS | Status: AC | PRN
  Administered 2023-10-22: 10 mL via INTRAVENOUS

## 2023-11-02 DIAGNOSIS — R339 Retention of urine, unspecified: Secondary | ICD-10-CM | POA: Diagnosis not present

## 2023-11-14 DIAGNOSIS — D519 Vitamin B12 deficiency anemia, unspecified: Secondary | ICD-10-CM | POA: Diagnosis not present

## 2023-11-20 DIAGNOSIS — I10 Essential (primary) hypertension: Secondary | ICD-10-CM | POA: Diagnosis not present

## 2023-12-03 DIAGNOSIS — R339 Retention of urine, unspecified: Secondary | ICD-10-CM | POA: Diagnosis not present

## 2023-12-04 DIAGNOSIS — I10 Essential (primary) hypertension: Secondary | ICD-10-CM | POA: Diagnosis not present

## 2023-12-14 DIAGNOSIS — I10 Essential (primary) hypertension: Secondary | ICD-10-CM | POA: Diagnosis not present

## 2023-12-19 DIAGNOSIS — D519 Vitamin B12 deficiency anemia, unspecified: Secondary | ICD-10-CM | POA: Diagnosis not present

## 2023-12-24 DIAGNOSIS — I10 Essential (primary) hypertension: Secondary | ICD-10-CM | POA: Diagnosis not present

## 2024-01-02 DIAGNOSIS — I10 Essential (primary) hypertension: Secondary | ICD-10-CM | POA: Diagnosis not present

## 2024-01-23 DIAGNOSIS — R972 Elevated prostate specific antigen [PSA]: Secondary | ICD-10-CM | POA: Diagnosis not present

## 2024-01-23 DIAGNOSIS — E1169 Type 2 diabetes mellitus with other specified complication: Secondary | ICD-10-CM | POA: Diagnosis not present

## 2024-01-23 DIAGNOSIS — D519 Vitamin B12 deficiency anemia, unspecified: Secondary | ICD-10-CM | POA: Diagnosis not present

## 2024-01-23 DIAGNOSIS — E785 Hyperlipidemia, unspecified: Secondary | ICD-10-CM | POA: Diagnosis not present

## 2024-01-25 DIAGNOSIS — D519 Vitamin B12 deficiency anemia, unspecified: Secondary | ICD-10-CM | POA: Diagnosis not present

## 2024-01-25 DIAGNOSIS — R339 Retention of urine, unspecified: Secondary | ICD-10-CM | POA: Diagnosis not present

## 2024-01-25 DIAGNOSIS — I10 Essential (primary) hypertension: Secondary | ICD-10-CM | POA: Diagnosis not present

## 2024-01-25 DIAGNOSIS — E785 Hyperlipidemia, unspecified: Secondary | ICD-10-CM | POA: Diagnosis not present

## 2024-01-25 DIAGNOSIS — I679 Cerebrovascular disease, unspecified: Secondary | ICD-10-CM | POA: Diagnosis not present

## 2024-01-25 DIAGNOSIS — E1169 Type 2 diabetes mellitus with other specified complication: Secondary | ICD-10-CM | POA: Diagnosis not present

## 2024-01-25 DIAGNOSIS — G8191 Hemiplegia, unspecified affecting right dominant side: Secondary | ICD-10-CM | POA: Diagnosis not present

## 2024-01-25 DIAGNOSIS — R972 Elevated prostate specific antigen [PSA]: Secondary | ICD-10-CM | POA: Diagnosis not present

## 2024-02-27 DIAGNOSIS — D519 Vitamin B12 deficiency anemia, unspecified: Secondary | ICD-10-CM | POA: Diagnosis not present

## 2024-03-14 DIAGNOSIS — R339 Retention of urine, unspecified: Secondary | ICD-10-CM | POA: Diagnosis not present

## 2024-04-01 DIAGNOSIS — R972 Elevated prostate specific antigen [PSA]: Secondary | ICD-10-CM | POA: Diagnosis not present

## 2024-04-01 DIAGNOSIS — N319 Neuromuscular dysfunction of bladder, unspecified: Secondary | ICD-10-CM | POA: Diagnosis not present

## 2024-04-01 DIAGNOSIS — N401 Enlarged prostate with lower urinary tract symptoms: Secondary | ICD-10-CM | POA: Diagnosis not present

## 2024-04-02 DIAGNOSIS — D519 Vitamin B12 deficiency anemia, unspecified: Secondary | ICD-10-CM | POA: Diagnosis not present

## 2024-05-02 DIAGNOSIS — E1169 Type 2 diabetes mellitus with other specified complication: Secondary | ICD-10-CM | POA: Diagnosis not present

## 2024-05-02 DIAGNOSIS — I10 Essential (primary) hypertension: Secondary | ICD-10-CM | POA: Diagnosis not present

## 2024-05-02 DIAGNOSIS — E785 Hyperlipidemia, unspecified: Secondary | ICD-10-CM | POA: Diagnosis not present

## 2024-05-02 DIAGNOSIS — G8191 Hemiplegia, unspecified affecting right dominant side: Secondary | ICD-10-CM | POA: Diagnosis not present

## 2024-05-02 DIAGNOSIS — I679 Cerebrovascular disease, unspecified: Secondary | ICD-10-CM | POA: Diagnosis not present

## 2024-05-02 DIAGNOSIS — D519 Vitamin B12 deficiency anemia, unspecified: Secondary | ICD-10-CM | POA: Diagnosis not present

## 2024-05-02 DIAGNOSIS — R972 Elevated prostate specific antigen [PSA]: Secondary | ICD-10-CM | POA: Diagnosis not present

## 2024-05-02 DIAGNOSIS — N401 Enlarged prostate with lower urinary tract symptoms: Secondary | ICD-10-CM | POA: Diagnosis not present

## 2024-05-07 DIAGNOSIS — D519 Vitamin B12 deficiency anemia, unspecified: Secondary | ICD-10-CM | POA: Diagnosis not present

## 2024-05-14 DIAGNOSIS — E1169 Type 2 diabetes mellitus with other specified complication: Secondary | ICD-10-CM | POA: Diagnosis not present

## 2024-05-14 DIAGNOSIS — E785 Hyperlipidemia, unspecified: Secondary | ICD-10-CM | POA: Diagnosis not present

## 2024-05-14 DIAGNOSIS — N183 Chronic kidney disease, stage 3 unspecified: Secondary | ICD-10-CM | POA: Diagnosis not present

## 2024-06-06 DIAGNOSIS — N183 Chronic kidney disease, stage 3 unspecified: Secondary | ICD-10-CM | POA: Diagnosis not present

## 2024-06-06 DIAGNOSIS — N400 Enlarged prostate without lower urinary tract symptoms: Secondary | ICD-10-CM | POA: Diagnosis not present

## 2024-06-11 DIAGNOSIS — D519 Vitamin B12 deficiency anemia, unspecified: Secondary | ICD-10-CM | POA: Diagnosis not present
